# Patient Record
Sex: Female | Born: 1975 | Race: White | Hispanic: No | Marital: Married | Smoking: Never smoker
Health system: Southern US, Community
[De-identification: ages and names within clinical notes are randomized; demographics above are authoritative.]

## PROBLEM LIST (undated history)

## (undated) DIAGNOSIS — N809 Endometriosis, unspecified: Secondary | ICD-10-CM

## (undated) DIAGNOSIS — R519 Headache, unspecified: Secondary | ICD-10-CM

## (undated) DIAGNOSIS — K5190 Ulcerative colitis, unspecified, without complications: Secondary | ICD-10-CM

## (undated) DIAGNOSIS — R51 Headache: Secondary | ICD-10-CM

## (undated) HISTORY — DX: Headache: R51

## (undated) HISTORY — PX: TYMPANOSTOMY TUBE PLACEMENT: SHX32

## (undated) HISTORY — DX: Endometriosis, unspecified: N80.9

## (undated) HISTORY — DX: Headache, unspecified: R51.9

## (undated) HISTORY — DX: Ulcerative colitis, unspecified, without complications: K51.90

---

## 1997-04-17 HISTORY — PX: OVARIAN CYST REMOVAL: SHX89

## 1997-09-02 ENCOUNTER — Other Ambulatory Visit: Admission: RE | Admit: 1997-09-02 | Discharge: 1997-09-02 | Payer: Self-pay | Admitting: Obstetrics

## 1998-09-06 ENCOUNTER — Inpatient Hospital Stay (HOSPITAL_COMMUNITY): Admission: RE | Admit: 1998-09-06 | Discharge: 1998-09-07 | Payer: Self-pay | Admitting: Gynecology

## 2000-04-30 ENCOUNTER — Other Ambulatory Visit: Admission: RE | Admit: 2000-04-30 | Discharge: 2000-04-30 | Payer: Self-pay | Admitting: Gynecology

## 2000-04-30 ENCOUNTER — Encounter (INDEPENDENT_AMBULATORY_CARE_PROVIDER_SITE_OTHER): Payer: Self-pay | Admitting: Specialist

## 2000-05-25 ENCOUNTER — Encounter (INDEPENDENT_AMBULATORY_CARE_PROVIDER_SITE_OTHER): Payer: Self-pay

## 2000-05-25 ENCOUNTER — Other Ambulatory Visit: Admission: RE | Admit: 2000-05-25 | Discharge: 2000-05-25 | Payer: Self-pay | Admitting: Gynecology

## 2000-09-03 ENCOUNTER — Other Ambulatory Visit: Admission: RE | Admit: 2000-09-03 | Discharge: 2000-09-03 | Payer: Self-pay | Admitting: Gynecology

## 2001-01-07 ENCOUNTER — Other Ambulatory Visit: Admission: RE | Admit: 2001-01-07 | Discharge: 2001-01-07 | Payer: Self-pay | Admitting: Gynecology

## 2001-04-17 HISTORY — PX: LEEP: SHX91

## 2001-05-20 ENCOUNTER — Other Ambulatory Visit: Admission: RE | Admit: 2001-05-20 | Discharge: 2001-05-20 | Payer: Self-pay | Admitting: Gynecology

## 2003-04-18 HISTORY — PX: LEFT OOPHORECTOMY: SHX1961

## 2003-04-18 HISTORY — PX: OTHER SURGICAL HISTORY: SHX169

## 2005-04-17 DIAGNOSIS — K5190 Ulcerative colitis, unspecified, without complications: Secondary | ICD-10-CM

## 2005-04-17 HISTORY — DX: Ulcerative colitis, unspecified, without complications: K51.90

## 2005-10-02 ENCOUNTER — Ambulatory Visit: Payer: Self-pay | Admitting: Gastroenterology

## 2005-10-17 ENCOUNTER — Ambulatory Visit: Payer: Self-pay | Admitting: Gastroenterology

## 2005-10-23 ENCOUNTER — Ambulatory Visit: Payer: Self-pay | Admitting: Gastroenterology

## 2005-10-23 ENCOUNTER — Encounter (INDEPENDENT_AMBULATORY_CARE_PROVIDER_SITE_OTHER): Payer: Self-pay | Admitting: Specialist

## 2005-11-06 ENCOUNTER — Encounter: Payer: Self-pay | Admitting: Gastroenterology

## 2005-11-06 ENCOUNTER — Encounter: Payer: Self-pay | Admitting: Obstetrics and Gynecology

## 2005-11-07 ENCOUNTER — Ambulatory Visit: Payer: Self-pay | Admitting: Gastroenterology

## 2005-11-07 ENCOUNTER — Inpatient Hospital Stay (HOSPITAL_COMMUNITY): Admission: AD | Admit: 2005-11-07 | Discharge: 2005-11-10 | Payer: Self-pay | Admitting: Gastroenterology

## 2005-11-16 ENCOUNTER — Ambulatory Visit: Payer: Self-pay | Admitting: Internal Medicine

## 2005-12-13 ENCOUNTER — Other Ambulatory Visit: Admission: RE | Admit: 2005-12-13 | Discharge: 2005-12-13 | Payer: Self-pay | Admitting: Obstetrics and Gynecology

## 2006-01-25 ENCOUNTER — Ambulatory Visit (HOSPITAL_COMMUNITY): Admission: RE | Admit: 2006-01-25 | Discharge: 2006-01-25 | Payer: Self-pay | Admitting: Obstetrics and Gynecology

## 2006-12-19 ENCOUNTER — Other Ambulatory Visit: Admission: RE | Admit: 2006-12-19 | Discharge: 2006-12-19 | Payer: Self-pay | Admitting: Gynecology

## 2008-02-10 ENCOUNTER — Other Ambulatory Visit: Admission: RE | Admit: 2008-02-10 | Discharge: 2008-02-10 | Payer: Self-pay | Admitting: Gynecology

## 2008-10-14 ENCOUNTER — Ambulatory Visit: Payer: Self-pay | Admitting: Oncology

## 2008-11-04 LAB — CBC WITH DIFFERENTIAL (CANCER CENTER ONLY)
BASO#: 0 10*3/uL (ref 0.0–0.2)
BASO%: 0.5 % (ref 0.0–2.0)
EOS%: 1.6 % (ref 0.0–7.0)
Eosinophils Absolute: 0 10*3/uL (ref 0.0–0.5)
HCT: 36.3 % (ref 34.8–46.6)
HGB: 12.9 g/dL (ref 11.6–15.9)
LYMPH#: 1.1 10*3/uL (ref 0.9–3.3)
LYMPH%: 44.7 % (ref 14.0–48.0)
MCH: 31.8 pg (ref 26.0–34.0)
MCHC: 35.6 g/dL (ref 32.0–36.0)
MCV: 89 fL (ref 81–101)
MONO#: 0.2 10*3/uL (ref 0.1–0.9)
MONO%: 9.1 % (ref 0.0–13.0)
NEUT#: 1.1 10*3/uL — ABNORMAL LOW (ref 1.5–6.5)
NEUT%: 44.1 % (ref 39.6–80.0)
Platelets: 180 10*3/uL (ref 145–400)
RBC: 4.07 10*6/uL (ref 3.70–5.32)
RDW: 11.3 % (ref 10.5–14.6)
WBC: 2.4 10*3/uL — ABNORMAL LOW (ref 3.9–10.0)

## 2008-11-04 LAB — CMP (CANCER CENTER ONLY)
ALT(SGPT): 21 U/L (ref 10–47)
AST: 22 U/L (ref 11–38)
Albumin: 3 g/dL — ABNORMAL LOW (ref 3.3–5.5)
Alkaline Phosphatase: 43 U/L (ref 26–84)
BUN, Bld: 15 mg/dL (ref 7–22)
CO2: 27 mEq/L (ref 18–33)
Calcium: 9.1 mg/dL (ref 8.0–10.3)
Chloride: 99 mEq/L (ref 98–108)
Creat: 1.1 mg/dl (ref 0.6–1.2)
Glucose, Bld: 96 mg/dL (ref 73–118)
Potassium: 4.3 mEq/L (ref 3.3–4.7)
Sodium: 140 mEq/L (ref 128–145)
Total Bilirubin: 0.5 mg/dl (ref 0.20–1.60)
Total Protein: 7 g/dL (ref 6.4–8.1)

## 2008-11-04 LAB — LACTATE DEHYDROGENASE: LDH: 127 U/L (ref 94–250)

## 2008-11-04 LAB — MORPHOLOGY - CHCC SATELLITE
PLT EST ~~LOC~~: ADEQUATE
RBC Comments: NORMAL

## 2009-02-11 ENCOUNTER — Ambulatory Visit: Payer: Self-pay | Admitting: Oncology

## 2009-02-15 LAB — CBC WITH DIFFERENTIAL (CANCER CENTER ONLY)
BASO#: 0 10*3/uL (ref 0.0–0.2)
BASO%: 0.5 % (ref 0.0–2.0)
EOS%: 2 % (ref 0.0–7.0)
Eosinophils Absolute: 0.1 10*3/uL (ref 0.0–0.5)
HCT: 37.5 % (ref 34.8–46.6)
HGB: 12.7 g/dL (ref 11.6–15.9)
LYMPH#: 1.2 10*3/uL (ref 0.9–3.3)
LYMPH%: 43.9 % (ref 14.0–48.0)
MCH: 31.2 pg (ref 26.0–34.0)
MCHC: 33.9 g/dL (ref 32.0–36.0)
MCV: 92 fL (ref 81–101)
MONO#: 0.2 10*3/uL (ref 0.1–0.9)
MONO%: 8.9 % (ref 0.0–13.0)
NEUT#: 1.2 10*3/uL — ABNORMAL LOW (ref 1.5–6.5)
NEUT%: 44.7 % (ref 39.6–80.0)
Platelets: 219 10*3/uL (ref 145–400)
RBC: 4.07 10*6/uL (ref 3.70–5.32)
RDW: 11.3 % (ref 10.5–14.6)
WBC: 2.7 10*3/uL — ABNORMAL LOW (ref 3.9–10.0)

## 2009-02-18 ENCOUNTER — Ambulatory Visit (HOSPITAL_COMMUNITY): Admission: RE | Admit: 2009-02-18 | Discharge: 2009-02-18 | Payer: Self-pay | Admitting: Oncology

## 2009-02-24 ENCOUNTER — Encounter: Payer: Self-pay | Admitting: Oncology

## 2009-02-24 ENCOUNTER — Other Ambulatory Visit: Admission: RE | Admit: 2009-02-24 | Discharge: 2009-02-24 | Payer: Self-pay | Admitting: Oncology

## 2009-02-24 LAB — CBC WITH DIFFERENTIAL (CANCER CENTER ONLY)
BASO#: 0 10*3/uL (ref 0.0–0.2)
BASO%: 0.8 % (ref 0.0–2.0)
EOS%: 1.7 % (ref 0.0–7.0)
Eosinophils Absolute: 0.1 10*3/uL (ref 0.0–0.5)
HCT: 38.8 % (ref 34.8–46.6)
HGB: 13 g/dL (ref 11.6–15.9)
LYMPH#: 1.1 10*3/uL (ref 0.9–3.3)
LYMPH%: 38.6 % (ref 14.0–48.0)
MCH: 30.9 pg (ref 26.0–34.0)
MCHC: 33.6 g/dL (ref 32.0–36.0)
MCV: 92 fL (ref 81–101)
MONO#: 0.2 10*3/uL (ref 0.1–0.9)
MONO%: 8.7 % (ref 0.0–13.0)
NEUT#: 1.4 10*3/uL — ABNORMAL LOW (ref 1.5–6.5)
NEUT%: 50.2 % (ref 39.6–80.0)
Platelets: 178 10*3/uL (ref 145–400)
RBC: 4.22 10*6/uL (ref 3.70–5.32)
RDW: 11.4 % (ref 10.5–14.6)
WBC: 2.7 10*3/uL — ABNORMAL LOW (ref 3.9–10.0)

## 2009-03-25 ENCOUNTER — Ambulatory Visit: Payer: Self-pay | Admitting: Oncology

## 2009-03-30 LAB — CBC WITH DIFFERENTIAL (CANCER CENTER ONLY)
BASO#: 0 10*3/uL (ref 0.0–0.2)
BASO%: 0.6 % (ref 0.0–2.0)
EOS%: 2.4 % (ref 0.0–7.0)
Eosinophils Absolute: 0.1 10*3/uL (ref 0.0–0.5)
HCT: 36.8 % (ref 34.8–46.6)
HGB: 12.5 g/dL (ref 11.6–15.9)
LYMPH#: 0.9 10*3/uL (ref 0.9–3.3)
LYMPH%: 40.1 % (ref 14.0–48.0)
MCH: 31 pg (ref 26.0–34.0)
MCHC: 33.9 g/dL (ref 32.0–36.0)
MCV: 91 fL (ref 81–101)
MONO#: 0.2 10*3/uL (ref 0.1–0.9)
MONO%: 9.6 % (ref 0.0–13.0)
NEUT#: 1 10*3/uL — ABNORMAL LOW (ref 1.5–6.5)
NEUT%: 47.3 % (ref 39.6–80.0)
Platelets: 178 10*3/uL (ref 145–400)
RBC: 4.03 10*6/uL (ref 3.70–5.32)
RDW: 10.8 % (ref 10.5–14.6)
WBC: 2.2 10*3/uL — ABNORMAL LOW (ref 3.9–10.0)

## 2010-05-12 ENCOUNTER — Ambulatory Visit: Payer: Self-pay | Admitting: Oncology

## 2010-05-16 LAB — CBC WITH DIFFERENTIAL/PLATELET
BASO%: 0.5 % (ref 0.0–2.0)
Basophils Absolute: 0 10*3/uL (ref 0.0–0.1)
EOS%: 1.2 % (ref 0.0–7.0)
Eosinophils Absolute: 0 10*3/uL (ref 0.0–0.5)
HCT: 38.3 % (ref 34.8–46.6)
HGB: 13.1 g/dL (ref 11.6–15.9)
LYMPH%: 35.4 % (ref 14.0–49.7)
MCH: 30.5 pg (ref 25.1–34.0)
MCHC: 34.2 g/dL (ref 31.5–36.0)
MCV: 89.1 fL (ref 79.5–101.0)
MONO#: 0.3 10*3/uL (ref 0.1–0.9)
MONO%: 9.7 % (ref 0.0–14.0)
NEUT#: 1.6 10*3/uL (ref 1.5–6.5)
NEUT%: 53.2 % (ref 38.4–76.8)
Platelets: 195 10*3/uL (ref 145–400)
RBC: 4.3 10*6/uL (ref 3.70–5.45)
RDW: 12.1 % (ref 11.2–14.5)
WBC: 3 10*3/uL — ABNORMAL LOW (ref 3.9–10.3)
lymph#: 1.1 10*3/uL (ref 0.9–3.3)

## 2010-06-03 ENCOUNTER — Other Ambulatory Visit: Payer: Self-pay | Admitting: Gynecology

## 2010-07-20 LAB — BONE MARROW EXAM

## 2010-07-20 LAB — CHROMOSOME ANALYSIS, BONE MARROW

## 2010-09-02 NOTE — H&P (Signed)
Jasmine May, Jasmine May               ACCOUNT NO.:  [id number]   MEDICAL RECORD NO.:  [id number]          PATIENT TYPE:  INP   LOCATION:  5506                         FACILITY:  [facility]   PHYSICIAN:  Malcolm T. Russella Dar, M.D. [facility] OF BIRTH:  06-11-1975   DATE OF ADMISSION:  11/07/2005  DATE OF DISCHARGE:                                HISTORY & PHYSICAL   CHIEF COMPLAINT:  Worsening diarrhea and abdominal pain.   HISTORY OF PRESENT ILLNESS:  The patient has had a history of lower  abdominal pain with alternating diarrhea and constipation that initially was  thought to be IBS, but then she developed bright red blood per rectum and  mucus per rectum for several weeks and underwent a colonoscopy on October 23, 2005, showing left-sided colitis typical appearance for ulcerative colitis  and the biopsy showed IBD changes, ulcerative colitis favored over that of  Crohn's disease.  She was started on Colazal and Rowasa enemas on July 10.  By 3 days later, her symptoms had worsened and progressed in severity even  with the addition of prednisone and Vicodin on July 19.  She was in contact  with the office regarding her problems.  She had been having fevers to 101  degrees x2 days and ongoing nausea.  A CT scan was ordered for July 23, that  showed a question of a left dermoid cyst torsion.  She went to ER at [facility] for exam and a pelvic Doppler ultrasound which did not confirm the  torsion.  She was seen by Dr. Salvadore Dom.  She went home, but continues to  do poorly and was seen today in the office by Dr. Russella Dar and he admitted her  for supportive care.  There is a question as to perhaps her having a  secondary infection on top of her inflammatory bowel disease.   ALLERGIES:  None.   MEDICATIONS:  1. Colazal 750 mg three p.o. t.i.d.  2. Prednisone 40 mg daily.  3. Robinul Forte 1 daily.  4. Rowasa enemas at bedtime.  5. NuvaRing birth control every 3 weeks.  6. Vicodin 1-2 every 6  hours.   PAST MEDICAL HISTORY:  1. History of seasonal allergies.  2. Status post left ovarian cystectomy in 1998.  3. LEEP in 2003.  4. Tubes in her ears in 1979.  5. Hip surgery in 1992.  6. Prior history of migraine headaches.   SOCIAL HISTORY:  She is married.  She lives in [place].  She does not  smoke.  She does not drink.   PHYSICAL EXAMINATION:  VITAL SIGNS:  Blood pressure 106/52, pulse is 98,  respirations 18, temperature 98.5, weight 122.4 pounds.  HEENT:  Exam sclerae are nonicteric.  Conjunctiva is pink.  Extraocular  movements intact.  Oropharynx and mucous membranes are very dry.  No  exudates.  NECK:  Neck is supple, no JVD, no masses, no thyromegaly.  CHEST:  Clear to auscultation and percussion bilaterally.  No shortness of  breath.  No cough.  CARDIAC:  Regular rate and rhythm.  No murmurs, rubs or gallops.  ABDOMEN:  Abdomen is tender in the periumbilical region, no rebound,  guarding, hepatosplenomegaly, masses or hernias.  RECTAL:  Rectal exam was deferred.  Note, that she had no neoplasia just  colitis on the colonoscopy on July 9.  NEUROLOGIC:  No tremor.  Full strength in the upper and lower extremities.  She is alert and oriented x3.  Exam is without tremor.  DERMATOLOGIC:  No  jaundice.  No pallor.   LABORATORY DATA AND X-RAY FINDINGS:  CT scan of the abdomen and pelvis of  July 23, showed diffuse mild circumferential wall thickening of the colon  consistent with her ulcerative colitis.  No perforation, no abscess.  Also  seen was borderline mesenteric adenopathy likely inflammatory in nature.  A  large heterogeneous mass of 5.8 x 5.4 cm consistent with an ovarian dermoid.  The mass predisposes the patient to ovarian torsion.  There is also another  smaller fat containing lesion in the right ovary, also likely a dermoid.  There is a small amount of free fluid, but no abscesses.   IMPRESSION:  1. Left-sided ulcerative colitis.  Rule out  superimposed infectious      colitis.  Rule out Colazal side effects.  2. Left dermoid cyst seen on computed tomography scan yesterday.  Torsion      has been excluded by pelvic ultrasound yesterday.  3. Dehydration.   PLAN:  Plan to get stool studies including stool cultures and C. difficile  studies.  Discontinue the Colazal and support with IV fluids and IV  antibiotics of Cipro and Flagyl.  Pain control p.r.n.  Discontinue the  Rowasa enemas.  If the patient fails to improve within the next 24-48 hours,  will probably need a flexible sigmoidoscopy.   FAMILY HISTORY:  Mother and father both have elevated cholesterol.  Mother  has a mitral valve prolapse.  There is a first cousin who has inflammatory  bowel disease.     ______________________________  Jennye Moccasin, PA-C      ______________________________  Venita Lick. Russella Dar, M.D. [facility]    SG/MEDQ  D:  11/07/2005  T:  11/07/2005  Job:  [id number]

## 2010-09-02 NOTE — Assessment & Plan Note (Signed)
[facility]                           GASTROENTEROLOGY ON-CALL NOTE   KADIN, BERA                        MRN:          [id number]  DATE:11/02/2005                            DOB:          01-04-1976    TELEPHONE MESSAGE:   TELEPHONE NUMBER:  [phone].   Jasmine May is a 35 year old patient of Dr. Russella Dar with ulcerative colitis who  has had exacerbation of her disease and called the office yesterday and was  told to start on prednisone.  For some reason, prednisone was not called in  and she called in several times today and again did not get any response.  She was told that she would be put on 30 mg of prednisone.  She is having  severe abdominal pain.  She wanted me to call the prednisone in.  It appears  that she is having diarrhea, rectal bleeding and abdominal pain and she has  an appointment with Dr. Russella Dar.  I have called in prednisone as well as 20  tablets of Vicodin for her to [employer] at [phone] and I called the  patient back to assure her that the medications have been called in.  She  will start on prednisone 30 mg a day for a week and then cut back to 20 mg.  She also is to continue on her Robinul 42 mg twice a day.                                   Hedwig Morton. Juanda Chance, MD   DMB/MedQ  DD:  11/02/2005  DT:  11/03/2005  Job #:  [id number]   cc:   Venita Lick. Pleas Koch., MD, [facility]

## 2010-09-02 NOTE — Assessment & Plan Note (Signed)
[facility]                           GASTROENTEROLOGY OFFICE NOTE   Jasmine, May                    MRN:          [id number]  DATE:11/16/2005                            DOB:          1975/11/17    HISTORY:  The patient presents for post-hospitalization followup.  She is a  35 year old who was initially evaluated by Dr. Russella Dar, October 02, 2005 for  abdominal pain, rectal bleeding and alternating bowel habits.  She  subsequently underwent complete colonoscopy, October 23, 2005.  She was found to  have moderate left-sided colitis consistent with ulcerative colitis.  Biopsies from the left colon were consistent with ulcerative colitis.  Biopsies of the transverse colon and right colon were unremarkable.  She was  treated with Colazal and Mesalamine enemas.  She improved for a few days,  only to have worsening symptoms.  She was subsequently placed on 30 mg of  prednisone, without improvement.  Thereafter, she developed severe left-  sided abdominal pain and underwent a CT scan of the abdomen and pelvis.  She  was found to have a large heterogenous mass in the left adnexa, consistent  with an ovarian dermoid.  There was concern that she may have torsion.  However, after an evaluation at [facility] by Dr. Artist Pais, this  was apparently not the case (transvaginal ultrasound performed).  She was  then seen by Dr. Russella Dar in the office with ongoing symptoms and directly  admitted to the hospital.  In the hospital, the patient was placed on bowel  rest, IV steroids, and her aminosalicylates held.  By chance, she may have  been having a paradoxical reaction to the drugs.  She was also treated with  antispasmodics and provided analgesia.  In time, she improved in terms of  all parameters and was discharged home after four days.  Due to the  patient's preference, she has transferred her care to me.  She presents now  for one week followup.  Since  discharge, she has done well.  She has no  problems with recurrent pain.  She describes one soft but formed stool  daily.  No mucus or blood.  No fevers.  She is a little edgy on the  prednisone but otherwise well.  She is sleeping well.   CURRENT MEDICATIONS:  1.  Prednisone 40 mg daily.  2.  Vicodin p.r.n.  3.  NuvaRing.  4.  Multivitamin.  5.  Levbid and Lomotil though is not needing these.   PHYSICAL EXAMINATION:  Well-appearing female in no acute distress.  BLOOD PRESSURE:  106/52.  HEART RATE:  98 and regular.  WEIGHT:  122.4 pounds.  TEMPERATURE:  98.5 degrees Fahrenheit.  HEENT:  Sclerae are anicteric.  Conjunctivae are pink.  Oral mucosa intact.  LUNGS:  Clear.  HEART:  Regular.  ABDOMEN:  Soft and without tenderness, mass or hernia.   IMPRESSION:  Left-sided ulcerative colitis.  Recent flare up requiring a  short hospitalization.  Currently asymptomatic on 40 mg of prednisone.   RECOMMENDATIONS:  1.  We had a long discussion today regarding the nature of ulcerative  colitis, treatment, and expected course.  Literature provided as well as      access to the [facility] [employer] site.  2.  Prednisone taper over the next eight weeks, as outlined to the patient.  3.  Re-institute aminosalicylate therapy.  Would like to place her on Lialda      2.4 grams daily.  4.  Follow up with Dr. Thomasena Edis, as planned, regarding management of left      adnexal mass.  I feel this was responsible for her acute pain, despite      no evidence of torsion.  It would be unusual for her colitis to have      caused that type of pain, given the endoscopic appearance.  5.  Office followup here in at about one month after her gynecology      followup.                                   Wilhemina Bonito. Eda Keys., MD   JNP/MedQ  DD:  11/19/2005  DT:  11/20/2005  Job #:  [id number]   cc:   Artist Pais, MD  Leatha Gilding. Mezer, MD

## 2010-09-02 NOTE — Discharge Summary (Signed)
Jasmine May, Jasmine May               ACCOUNT NO.:  [id number]   MEDICAL RECORD NO.:  [id number]          PATIENT TYPE:  INP   LOCATION:  5505                         FACILITY:  [facility]   PHYSICIAN:  Wilhemina Bonito. Marina Goodell, MD      DATE OF BIRTH:  October 29, 1975   DATE OF ADMISSION:  11/07/2005  DATE OF DISCHARGE:  11/10/2005                                 DISCHARGE SUMMARY   ADMISSION DIAGNOSES:  1. History of left-sided ulcerative colitis.  Now with worsening diarrhea      and abdominal pain despite medical therapy.  Rule out superimposed      infectious colitis, rule out gastrointestinal/colonic side effects from      Colazal.  2. Left dermoid cyst of the pelvis.  3. Dehydration with hypotension.  4. Seasonal allergies.  5. Headaches labeled as migraines in the past.  6. Status post loop electrosurgical excision procedure surgery in 2003      also status post.  7. Status post hip surgery in 1992.  8. Status post tube placement in the ear canals in 1979.   DISCHARGE DIAGNOSES:  1. Ulcerative colitis flare with improvement on empiric Flagyl and steroid      dosing.  2. Dermoid ovarian cyst.  3. Anemia, normocytic.  4. Hyperglycemia, probably secondary to steroid therapy.  5. Hypokalemia, resolved.  6. Fecal occult blood positive secondary to colitis.   PROCEDURES:  None.   CONSULTATION:  None.   BRIEF HISTORY:  Jasmine May is a 35 year old white female who has recently  relocated from [place] to [place].  She underwent a colonoscopy on July 9 by  Dr. Russella Dar to work up a history of alternating diarrhea and constipation as  well as a history of bleeding per rectum which had been on for several weeks  prior to the colonoscopy.  The colonoscopy showed left-sided colitis with  mucosal changes typical for UC.  Biopsies also showed changes that favored a  diagnosis of UC over Crohn disease.  She was started on July 10 on Colazal  as well as Rowasa enemas.  Within 3 days her symptoms had worsened  and she  was feeling weak secondary to increased stooling.  The 19th she was started  on prednisone as well as a p.r.n. Vicodin for abdominal pain.  She failed to  improve with all of these measures and diarrhea worsened although the  bleeding had slowed.  For a couple of days prior to admission she had fevers  to 101 and is experiencing ongoing nausea.  A CT scan was performed on July  23 showing a question of torsion and a left dermoid cyst.  She was seen by a  gynecologist, Dr. Thomasena Edis, and had a pelvic ultrasound which showed failed  to demonstrate any torsion.  She was admitted by Dr. Russella Dar for supportive  care and further workup.   LABORATORIES:  Initial hemoglobin 12.3, 10.6 at discharge.  Hematocrit 36 on  admission, 30.5 at discharge.  Platelets 257,000.  White blood cell count  initially 11, 9.1 at discharge.  MCV 87.  Fecal occult blood positive.  ESR  36.  Initial potassium 3.2, corrected to 4.8.  Sodium 135.  Glucose  initially 95 but reached as high as 140.  BUN 7, creatinine 0.9.  Calcium  8.0, albumin 2.6.  Total bilirubin 1.0, alkaline phosphatase 39, AST 12, ALT  8.  Lipase 18.  Quantitative HCG was less than 3, which is an negative.  Stool culture revealed yielded no Salmonella, Shigella, Campylobacter or  Yersinia.  C. difficile toxin on 3 occasions always negative.  Giardia,  Cryptosporidium screening negative.   HOSPITAL COURSE:  Problem 1.  UNCONTROLLED COLITIS SYMPTOMS:  The patient was admitted to the  hospital and hydrated with IV fluids.  She was started on 40 mg of Solu-  Medrol twice daily as well as empiric Flagyl at 250 mg 4 times daily and  ciprofloxacin IV.  She had p.r.n. pain medications available.  Overnight she  had a decrease in the midabdominal pain and a slight decrease in stool  frequency.  Overall, there was not a whole lot of improvement.  Blood  pressure was generally low throughout the hospitalization with normal  systolic blood pressures in  the 80s and 90s and diastolic blood pressures in  the 50s and 60s.  She was continued on a course of empiric antibiotics.  Eventually the patient's diet was advanced from sips of clear liquids to a  low-residue diet.  Cortenemas were added in the evening along with Lomotil  twice daily.  Eventually the patient's stooling pattern rate was  significantly reduced; in fact, on the day of discharge she had not had any  bowel movements overnight or for several hours that day.  Rectal spasm had  been an issue, and this was resolving nicely.  She was feeling a lot better,  was hungry, tolerating a low-residue diet, and Dr. Marina Goodell discharged her to  home in stable and improved condition.  Medication changes during this  admission included discontinuing the Rowasa enemas as well as the Colazal  and adding Cortenemas and the discharge dose of prednisone was 40 mg a day  rather than the 30 mg day she had been taking on admission.  She also had  available hyoscyamine for cramping pain in addition to Vicodin.  She had a  follow-up appointment with Dr. Marina Goodell, she requested a shifting of physician  from Dr. Russella Dar to Dr. Marina Goodell, on August 2 at 3:30 p.m.   Various stool studies for infectious agents were obtained, none of which  were positive including multiple C. difficile studies.  There was no plan to  continue antibiotics at discharge.   Problem 2.  HYPERGLYCEMIA:  Serum glucoses did reveal elevations to a  maximum of 140 once she was taking Solu-Medrol.  She did not require any  sliding-scale insulin and we did not adjust her diet to that of a diabetic.   Problem 3.  NORMOCYTIC ANEMIA:  Her stools were heme-positive, which would  be expected in a patient was colitis.  Her MCV was normal.  We did not do  any iron, B12 or folate studies.   Problem 4.  OVARIAN DERMOID CYST:  No evidence for torsion and not felt to be the source of the patient's pelvic abdominal pain by her GYN, Dr.  Thomasena Edis.    MEDICATIONS AT DISCHARGE:  1. Prednisone 40 mg daily.  2. Lomotil one tablet twice daily p.r.n.  3. Cortenema at bedtime, could be discontinued at patient discretion if      rectal pain resolved.  4. Calcium  500 mg with vitamin D twice daily.  5. Hyoscyamine 0.375 mg one to two every 12 hours as needed for cramping      abdominal pain.  6. Multivitamin once daily.  7. Vicodin one every 6 hours p.r.n. pain.  8. NuvaRing (internal intravaginal contraceptive), replaced monthly.  9. Ambien 5 mg at h.s. as needed for rest.     ______________________________  Jennye Moccasin, PA-C    ______________________________  Wilhemina Bonito. Marina Goodell, MD    SG/MEDQ  D:  12/27/2005  T:  12/28/2005  Job:  [id number]

## 2010-09-02 NOTE — Assessment & Plan Note (Signed)
[facility]                                   ON-CALL NOTE   Jasmine May, Jasmine May                    MRN:          [id number]  DATE:11/01/2005                            DOB:          Aug 17, 1975    [name] is a 35 year old white woman that had ulcerative colitis, diagnosed  by Dr. Russella Dar, at colonoscopy about a week to 10 days ago.  She started on  Colazal and Asacol.  She felt a little better initially, but she really has  had persistent diarrhea, perhaps somewhat worse, with some crampy abdominal  pain and bleeding.  She is a little upset by this.  I explained to her that  it can take weeks for the medicine to work.  It is also possible she is  having a paradoxical reaction to Colazal.  She has not had any fever.  I  recommended that she use some Imodium if she has it.  If she gets severe  abdominal pain or fever, to stop that and contact us.  We will pass the  message along to Dr. Russella Dar, her regular gastroenterologist, about her  persistent symptoms and he can decide whether or not to change her therapy.                                   Iva Boop, MD, [facility]   CEG/MedQ  DD:  11/01/2005  DT:  11/02/2005  Job #:  [id number]   cc:   Venita Lick. Pleas Koch., MD, [facility]

## 2010-11-01 ENCOUNTER — Encounter: Payer: Self-pay | Admitting: Internal Medicine

## 2010-12-04 ENCOUNTER — Emergency Department (HOSPITAL_COMMUNITY)
Admission: EM | Admit: 2010-12-04 | Discharge: 2010-12-04 | Disposition: A | Discharge status: Home / Self Care | Payer: 59 | Attending: Emergency Medicine | Admitting: Emergency Medicine

## 2010-12-04 DIAGNOSIS — R109 Unspecified abdominal pain: Secondary | ICD-10-CM | POA: Insufficient documentation

## 2010-12-04 DIAGNOSIS — R112 Nausea with vomiting, unspecified: Secondary | ICD-10-CM | POA: Insufficient documentation

## 2010-12-04 DIAGNOSIS — K5289 Other specified noninfective gastroenteritis and colitis: Secondary | ICD-10-CM | POA: Insufficient documentation

## 2010-12-04 DIAGNOSIS — R197 Diarrhea, unspecified: Secondary | ICD-10-CM | POA: Insufficient documentation

## 2010-12-04 LAB — URINALYSIS, ROUTINE W REFLEX MICROSCOPIC
Glucose, UA: NEGATIVE mg/dL
Hgb urine dipstick: NEGATIVE
Ketones, ur: NEGATIVE mg/dL
Leukocytes, UA: NEGATIVE
Nitrite: NEGATIVE
Protein, ur: NEGATIVE mg/dL
Specific Gravity, Urine: 1.027 (ref 1.005–1.030)
Urobilinogen, UA: 1 mg/dL (ref 0.0–1.0)
pH: 6 (ref 5.0–8.0)

## 2010-12-04 LAB — POCT I-STAT, CHEM 8
BUN: 10 mg/dL (ref 6–23)
Calcium, Ion: 1.17 mmol/L (ref 1.12–1.32)
Chloride: 104 mEq/L (ref 96–112)
Creatinine, Ser: 0.8 mg/dL (ref 0.50–1.10)
Glucose, Bld: 100 mg/dL — ABNORMAL HIGH (ref 70–99)
HCT: 36 % (ref 36.0–46.0)
Hemoglobin: 12.2 g/dL (ref 12.0–15.0)
Potassium: 3.5 mEq/L (ref 3.5–5.1)
Sodium: 138 mEq/L (ref 135–145)
TCO2: 24 mmol/L (ref 0–100)

## 2010-12-04 LAB — POCT PREGNANCY, URINE: Preg Test, Ur: NEGATIVE

## 2010-12-28 ENCOUNTER — Other Ambulatory Visit: Payer: Self-pay | Admitting: Gastroenterology

## 2011-01-02 ENCOUNTER — Ambulatory Visit
Admission: RE | Admit: 2011-01-02 | Discharge: 2011-01-02 | Disposition: A | Discharge status: Home / Self Care | Payer: 59 | Source: Ambulatory Visit | Attending: Gastroenterology | Admitting: Gastroenterology

## 2011-02-08 ENCOUNTER — Encounter: Payer: Self-pay | Admitting: Internal Medicine

## 2011-02-10 ENCOUNTER — Encounter: Payer: Self-pay | Admitting: Gastroenterology

## 2012-06-18 ENCOUNTER — Other Ambulatory Visit: Payer: Self-pay | Admitting: Gastroenterology

## 2012-07-16 ENCOUNTER — Other Ambulatory Visit: Payer: Self-pay | Admitting: Gastroenterology

## 2012-09-21 ENCOUNTER — Emergency Department: Payer: Self-pay | Admitting: Emergency Medicine

## 2012-09-23 LAB — BETA STREP CULTURE(ARMC)

## 2013-08-08 ENCOUNTER — Other Ambulatory Visit: Payer: Self-pay | Admitting: Gastroenterology

## 2014-06-22 ENCOUNTER — Other Ambulatory Visit: Payer: Self-pay | Admitting: Gynecology

## 2014-06-23 LAB — CYTOLOGY - PAP

## 2014-07-15 ENCOUNTER — Other Ambulatory Visit: Payer: Self-pay | Admitting: Gynecology

## 2014-07-16 LAB — CYTOLOGY - PAP

## 2015-03-17 ENCOUNTER — Ambulatory Visit (INDEPENDENT_AMBULATORY_CARE_PROVIDER_SITE_OTHER): Payer: 59 | Admitting: Podiatry

## 2015-03-17 ENCOUNTER — Ambulatory Visit (INDEPENDENT_AMBULATORY_CARE_PROVIDER_SITE_OTHER): Payer: 59

## 2015-03-17 VITALS — BP 119/73 | HR 68 | Resp 16 | Ht 66.0 in | Wt 140.0 lb

## 2015-03-17 DIAGNOSIS — M722 Plantar fascial fibromatosis: Secondary | ICD-10-CM

## 2015-03-17 MED ORDER — TRIAMCINOLONE ACETONIDE 10 MG/ML IJ SUSP
10.0000 mg | Freq: Once | INTRAMUSCULAR | Status: AC
  Administered 2015-03-17: 10 mg

## 2015-03-17 NOTE — Patient Instructions (Signed)

## 2015-03-17 NOTE — Progress Notes (Signed)
   Subjective:    Patient ID: Jasmine May, female    DOB: [birth year], 39 y.o.   MRN: [id number]  HPI  Patient presents with bilateral foot pain, heel. Going on for past 6 weeks.  Patient also presents with ankle pain in their left foot, lateral side. Pt stated, "has sprained ankle 3 times in September 2016".  Pt saw Dr. Al CorpusHyatt approximately 8 years ago and was diagnosed with Plantar Fasciitis.     Review of Systems  All other systems reviewed and are negative.      Objective:   Physical Exam        Assessment & Plan:

## 2015-03-17 NOTE — Progress Notes (Signed)
Subjective:     Patient ID: Jasmine May, female   DOB: [birth year], [age] y.o.   MRN: [id number]  HPI patient states my heels have become severely sore again and I been trying to exercise and run. I also lost my night splint and I need a new one as it was helpful and I need new orthotics   Review of Systems  All other systems reviewed and are negative.      Objective:   Physical Exam  Constitutional: She is oriented to person, place, and time.  Musculoskeletal: Normal range of motion.  Neurological: She is oriented to person, place, and time.  Skin: Skin is warm.  Nursing note and vitals reviewed.  neurovascular status intact muscle strength adequate range of motion within normal limits with patient noted to have thin heels and exquisite discomfort in the medial band left heel at its insertion into the calcaneus. Patient has moderate depression of the arch bilateral and inability to walk without extreme discomfort and is noted to have good digital perfusion and is well oriented 3     Assessment:     Planter fasciitis left with structural imbalances of the foot    Plan:     H&P conditions and x-rays reviewed and today I injected the plantar fascial left 3 mg Kenalog 5 mill grams Xylocaine and applied fascial brace difference lifting of the arch. I then went ahead and scanned for custom orthotic devices to take stress off her feet and will reappoint when those are ready

## 2015-03-23 ENCOUNTER — Telehealth: Payer: Self-pay

## 2015-03-23 NOTE — Telephone Encounter (Signed)
Returning Jasmine May's call, wanted to set up a new patient appt for herself with Dr. Marice Potterove. No answer, left message, instructing her to return my call here at the office

## 2015-04-08 ENCOUNTER — Ambulatory Visit: Payer: 59 | Admitting: *Deleted

## 2015-04-08 DIAGNOSIS — M722 Plantar fascial fibromatosis: Secondary | ICD-10-CM

## 2015-04-08 NOTE — Progress Notes (Signed)
Patient ID: Jasmine GarnetMichelle Lee May, female   DOB: November 09, 1975, 39 y.o.   MRN: [id number] Patient presents for orthotic pick up.  Verbal and written break in and wear instructions given.  Patient will follow up in 4 weeks if symptoms worsen or fail to improve.

## 2015-04-08 NOTE — Patient Instructions (Signed)

## 2015-04-14 ENCOUNTER — Ambulatory Visit (INDEPENDENT_AMBULATORY_CARE_PROVIDER_SITE_OTHER): Payer: 59 | Admitting: Obstetrics & Gynecology

## 2015-04-14 ENCOUNTER — Encounter: Payer: Self-pay | Admitting: Obstetrics & Gynecology

## 2015-04-14 VITALS — BP 118/87 | HR 93 | Resp 18 | Ht 66.0 in | Wt 147.0 lb

## 2015-04-14 DIAGNOSIS — N760 Acute vaginitis: Secondary | ICD-10-CM

## 2015-04-14 DIAGNOSIS — N898 Other specified noninflammatory disorders of vagina: Secondary | ICD-10-CM

## 2015-04-14 DIAGNOSIS — L298 Other pruritus: Secondary | ICD-10-CM | POA: Diagnosis not present

## 2015-04-14 MED ORDER — METRONIDAZOLE 500 MG PO TABS: 500.0000 mg | ORAL_TABLET | Freq: Two times a day (BID) | ORAL | Status: DC

## 2015-04-14 MED ORDER — FLUCONAZOLE 150 MG PO TABS: 150.0000 mg | ORAL_TABLET | Freq: Once | ORAL | Status: DC

## 2015-04-14 MED ORDER — LO LOESTRIN FE 1 MG-10 MCG / 10 MCG PO TABS: 1.0000 | ORAL_TABLET | Freq: Every day | ORAL | Status: DC

## 2015-04-14 NOTE — Progress Notes (Signed)
   Subjective:    Patient ID: Amadeo GarnetMichelle Lee Dufrane, female    DOB: November 01, 1975, 39 y.o.   MRN: [id number]  HPI 39 yo MW P2 (4112 and 39 yo daughters) here today with the complaint of vaginal itching for about 5 days. She has not used any OTC meds.   Review of Systems Pap due 2/17 Uses Lo lo estrin for contraception.     Objective:   Physical Exam [id number] Breathing, conversing, and ambulating normally Vulva- shaved, no lesions Vagina- frothy white discharge c/w BV     Assessment & Plan:  Probable BV- wet prep sent, flagyl prescribed Diflucan script given prn RTC for annual in the spring

## 2015-04-15 LAB — WET PREP, GENITAL
Trich, Wet Prep: NONE SEEN
Yeast Wet Prep HPF POC: NONE SEEN

## 2015-04-21 ENCOUNTER — Telehealth: Payer: Self-pay | Admitting: *Deleted

## 2015-04-21 NOTE — Telephone Encounter (Signed)
Called pt, no answer, left message to call office.

## 2015-04-21 NOTE — Telephone Encounter (Signed)
-----   Message from CourtlandJacinda S Battle sent at 04/20/2015  4:18 PM EST ----- Regarding: Rx Advise Contact: [phone] Taken the med for BV, having some burning and itching, thinks she is getting a yeast infection, wants to know if she can take both meds together or should she wait until she finishes the first one

## 2015-04-26 ENCOUNTER — Telehealth: Payer: Self-pay | Admitting: Obstetrics and Gynecology

## 2015-04-26 NOTE — Telephone Encounter (Signed)
Jasmine May called the office on 01/06, spoke with after hours answering service. She complained of symptoms of a yeast infection, after hours answering service paged the MD on call, who advised her that she would need to return to the office to be seen. Called patient on 04/26/2015 @ 1256 in an attempt to schedule her an appt. Patient states she no longer has symptoms of a yeast infection, declined making an appt at this time. Instructed her to call the office back if symptoms return.

## 2015-05-20 ENCOUNTER — Encounter: Payer: Self-pay | Admitting: *Deleted

## 2015-06-03 ENCOUNTER — Ambulatory Visit: Payer: 59 | Admitting: Podiatry

## 2015-09-20 ENCOUNTER — Other Ambulatory Visit: Payer: Self-pay | Admitting: Gastroenterology

## 2016-01-09 ENCOUNTER — Encounter: Payer: Self-pay | Admitting: Emergency Medicine

## 2016-01-09 DIAGNOSIS — Z79899 Other long term (current) drug therapy: Secondary | ICD-10-CM | POA: Diagnosis not present

## 2016-01-09 DIAGNOSIS — Z792 Long term (current) use of antibiotics: Secondary | ICD-10-CM | POA: Insufficient documentation

## 2016-01-09 DIAGNOSIS — R1110 Vomiting, unspecified: Secondary | ICD-10-CM | POA: Diagnosis not present

## 2016-01-09 DIAGNOSIS — R51 Headache: Secondary | ICD-10-CM | POA: Diagnosis present

## 2016-01-09 DIAGNOSIS — Z5321 Procedure and treatment not carried out due to patient leaving prior to being seen by health care provider: Secondary | ICD-10-CM | POA: Diagnosis not present

## 2016-01-09 MED ORDER — ONDANSETRON 4 MG PO TBDP
4.0000 mg | ORAL_TABLET | Freq: Once | ORAL | Status: AC
  Administered 2016-01-09: 4 mg via ORAL

## 2016-01-09 MED ORDER — ONDANSETRON 4 MG PO TBDP
ORAL_TABLET | ORAL | Status: DC
Start: 2016-01-09 — End: 2016-01-10
  Filled 2016-01-09: qty 1

## 2016-01-09 NOTE — ED Triage Notes (Signed)
Pt presents to ED with c/o severe migraine headache since around 1900 tonight. Pt states she has a hx of the same but this is worse then any previous. Vomiting X1 and sensitivity to light and sounds.

## 2016-01-10 ENCOUNTER — Emergency Department
Admission: EM | Admit: 2016-01-10 | Discharge: 2016-01-10 | Disposition: A | Discharge status: Home / Self Care | Payer: 59 | Attending: Emergency Medicine | Admitting: Emergency Medicine

## 2016-01-10 MED ORDER — SODIUM CHLORIDE 0.9 % IV BOLUS (SEPSIS): 1000.0000 mL | Freq: Once | INTRAVENOUS | Status: DC

## 2016-01-10 MED ORDER — DIPHENHYDRAMINE HCL 50 MG/ML IJ SOLN: 25.0000 mg | Freq: Once | INTRAMUSCULAR | Status: DC

## 2016-01-10 MED ORDER — KETOROLAC TROMETHAMINE 30 MG/ML IJ SOLN
30.0000 mg | Freq: Once | INTRAMUSCULAR | Status: DC
Start: 2016-01-10 — End: 2016-01-10

## 2016-01-10 MED ORDER — METOCLOPRAMIDE HCL 5 MG/ML IJ SOLN: 10.0000 mg | Freq: Once | INTRAMUSCULAR | Status: DC

## 2016-01-10 NOTE — ED Notes (Signed)
In to assess patient.  Patient states that she feels back to her normal.  States she came because her usual medications did not work in the time frame they usually do.  Patient states that she has a headache specialist and that she plans to call him and she just wants to go home.  Encouraged patient to stay for exam by physician, but patient refused.  Encouraged patient to return if any return of symptoms or other concerns, verbalized understanding.

## 2016-01-31 ENCOUNTER — Telehealth: Payer: Self-pay | Admitting: *Deleted

## 2016-01-31 MED ORDER — DESOGESTREL-ETHINYL ESTRADIOL 0.15-30 MG-MCG PO TABS: 1.0000 | ORAL_TABLET | Freq: Every day | ORAL | 11 refills | Status: DC

## 2016-01-31 NOTE — Telephone Encounter (Signed)
Pt called in stating she can't refill Lo-Loestrin due to insurance not covering this medication any longer. She would like the same strength OCP but wants to know if there are other options that could possible be covered.

## 2016-01-31 NOTE — Telephone Encounter (Signed)
Sent Apri per dr Shawnie Ponspratt

## 2016-01-31 NOTE — Telephone Encounter (Signed)
error 

## 2016-05-06 ENCOUNTER — Other Ambulatory Visit: Payer: Self-pay | Admitting: Obstetrics & Gynecology

## 2016-06-05 ENCOUNTER — Encounter: Payer: Self-pay | Admitting: Obstetrics & Gynecology

## 2016-06-05 ENCOUNTER — Ambulatory Visit (INDEPENDENT_AMBULATORY_CARE_PROVIDER_SITE_OTHER): Payer: 59 | Admitting: Obstetrics & Gynecology

## 2016-06-05 VITALS — BP 127/90 | HR 72 | Ht 66.0 in | Wt 147.0 lb

## 2016-06-05 DIAGNOSIS — Z01419 Encounter for gynecological examination (general) (routine) without abnormal findings: Secondary | ICD-10-CM

## 2016-06-05 DIAGNOSIS — Z0000 Encounter for general adult medical examination without abnormal findings: Secondary | ICD-10-CM | POA: Diagnosis not present

## 2016-06-05 MED ORDER — LEVONORGEST-ETH ESTRAD 91-DAY 0.15-0.03 &0.01 MG PO TABS: 1.0000 | ORAL_TABLET | Freq: Every day | ORAL | 4 refills | Status: DC

## 2016-06-05 MED ORDER — FLUCONAZOLE 150 MG PO TABS: 150.0000 mg | ORAL_TABLET | Freq: Once | ORAL | 3 refills | Status: AC

## 2016-06-05 NOTE — Progress Notes (Signed)
Subjective:    Jasmine May is a 41 y.o. MW P2 (5419 and 41 yo daughters) female who presents for an annual exam. The patient has no complaints today. The patient is sexually active. GYN screening history: last pap: was normal. The patient wears seatbelts: yes. The patient participates in regular exercise: yes. Has the patient ever been transfused or tattooed?: yes. The patient reports that there is not domestic violence in her life.   Menstrual History: OB History    Gravida Para Term Preterm AB Living   3 2 2   1 2    SAB TAB Ectopic Multiple Live Births   1       2      Menarche age: [age] No LMP recorded. Patient is not currently having periods (Reason: Oral contraceptives).    The following portions of the patient's history were reviewed and updated as appropriate: allergies, current medications, past family history, past medical history, past social history, past surgical history and problem list.  Review of Systems Pertinent items are noted in HPI.   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, Radio producerquality engineer) Daughter is a Printmakerreshman at [place]    Objective:    BP 127/90   Pulse 72   Ht 5\' 6"  (1.676 m)   Wt 147 lb (66.7 kg)   BMI 23.73 kg/m   General Appearance:    Alert, cooperative, no distress, appears stated age  Head:    Normocephalic, without obvious abnormality, atraumatic  Eyes:    PERRL, conjunctiva/corneas clear, EOM's intact, fundi    benign, both eyes  Ears:    Normal TM's and external ear canals, both ears  Nose:   Nares normal, septum midline, mucosa normal, no drainage    or sinus tenderness  Throat:   Lips, mucosa, and tongue normal; teeth and gums normal  Neck:   Supple, symmetrical, trachea midline, no adenopathy;    thyroid:  no enlargement/tenderness/nodules; no carotid   bruit or JVD  Back:     Symmetric, no curvature, ROM normal, no CVA tenderness  Lungs:     Clear to  auscultation bilaterally, respirations unlabored  Chest Wall:    No tenderness or deformity   Heart:    Regular rate and rhythm, S1 and S2 normal, no murmur, rub   or gallop  Breast Exam:    No tenderness, masses, or nipple abnormality  Abdomen:     Soft, non-tender, bowel sounds active all four quadrants,    no masses, no organomegaly  Genitalia:    Normal female without lesion, discharge or tenderness, NSSA, NT, mobile, no adnexal masses or tenderness     Extremities:   Extremities normal, atraumatic, no cyanosis or edema  Pulses:   2+ and symmetric all extremities  Skin:   Skin color, texture, turgor normal, no rashes or lesions  Lymph nodes:   Cervical, supraclavicular, and axillary nodes normal  Neurologic:   CNII-XII intact, normal strength, sensation and reflexes    throughout  .    Assessment:    Healthy female exam.   endometriosis Plan:     Thin prep Pap smear. with cotesting camrese (extended cycle) Diflucan prn

## 2016-06-06 ENCOUNTER — Encounter: Payer: Self-pay | Admitting: Obstetrics & Gynecology

## 2016-06-07 LAB — CYTOLOGY - PAP
Adequacy: ABSENT
Diagnosis: NEGATIVE
HPV: NOT DETECTED

## 2016-06-20 ENCOUNTER — Ambulatory Visit: Payer: 59

## 2016-06-23 ENCOUNTER — Ambulatory Visit
Admission: RE | Admit: 2016-06-23 | Discharge: 2016-06-23 | Disposition: A | Discharge status: Home / Self Care | Payer: BLUE CROSS/BLUE SHIELD | Source: Ambulatory Visit | Attending: Obstetrics & Gynecology | Admitting: Obstetrics & Gynecology

## 2016-06-23 DIAGNOSIS — K529 Noninfective gastroenteritis and colitis, unspecified: Secondary | ICD-10-CM | POA: Diagnosis not present

## 2016-06-23 DIAGNOSIS — Z01419 Encounter for gynecological examination (general) (routine) without abnormal findings: Secondary | ICD-10-CM

## 2016-06-23 DIAGNOSIS — Z1231 Encounter for screening mammogram for malignant neoplasm of breast: Secondary | ICD-10-CM | POA: Diagnosis not present

## 2016-07-10 DIAGNOSIS — G43009 Migraine without aura, not intractable, without status migrainosus: Secondary | ICD-10-CM | POA: Diagnosis not present

## 2016-07-10 DIAGNOSIS — G43829 Menstrual migraine, not intractable, without status migrainosus: Secondary | ICD-10-CM | POA: Diagnosis not present

## 2016-08-03 DIAGNOSIS — Z79899 Other long term (current) drug therapy: Secondary | ICD-10-CM | POA: Diagnosis not present

## 2016-08-03 DIAGNOSIS — K529 Noninfective gastroenteritis and colitis, unspecified: Secondary | ICD-10-CM | POA: Diagnosis not present

## 2016-09-07 DIAGNOSIS — K5140 Inflammatory polyps of colon without complications: Secondary | ICD-10-CM | POA: Diagnosis not present

## 2016-09-07 DIAGNOSIS — K5150 Left sided colitis without complications: Secondary | ICD-10-CM | POA: Diagnosis not present

## 2016-09-07 DIAGNOSIS — K5289 Other specified noninfective gastroenteritis and colitis: Secondary | ICD-10-CM | POA: Diagnosis not present

## 2016-09-14 DIAGNOSIS — K529 Noninfective gastroenteritis and colitis, unspecified: Secondary | ICD-10-CM | POA: Diagnosis not present

## 2016-09-14 DIAGNOSIS — Z79899 Other long term (current) drug therapy: Secondary | ICD-10-CM | POA: Diagnosis not present

## 2016-09-22 DIAGNOSIS — K51311 Ulcerative (chronic) rectosigmoiditis with rectal bleeding: Secondary | ICD-10-CM | POA: Diagnosis not present

## 2016-09-25 DIAGNOSIS — G43839 Menstrual migraine, intractable, without status migrainosus: Secondary | ICD-10-CM | POA: Diagnosis not present

## 2016-09-25 DIAGNOSIS — G43009 Migraine without aura, not intractable, without status migrainosus: Secondary | ICD-10-CM | POA: Diagnosis not present

## 2016-10-26 DIAGNOSIS — K5150 Left sided colitis without complications: Secondary | ICD-10-CM | POA: Diagnosis not present

## 2016-10-30 ENCOUNTER — Telehealth: Payer: Self-pay | Admitting: *Deleted

## 2016-10-30 DIAGNOSIS — B9689 Other specified bacterial agents as the cause of diseases classified elsewhere: Secondary | ICD-10-CM

## 2016-10-30 DIAGNOSIS — N760 Acute vaginitis: Secondary | ICD-10-CM

## 2016-10-30 MED ORDER — TINIDAZOLE 500 MG PO TABS: 2.0000 g | ORAL_TABLET | Freq: Every day | ORAL | 2 refills | Status: DC

## 2016-10-30 MED ORDER — CLINDAMYCIN HCL 300 MG PO CAPS: 300.0000 mg | ORAL_CAPSULE | Freq: Two times a day (BID) | ORAL | 2 refills | Status: DC

## 2016-10-30 NOTE — Telephone Encounter (Signed)
Pt called the office c/o breaking out in a hive-like rash after starting the Flagyl.  States she has taken Flagyl one other time and had no problems.  Informed pt to stop Flagyl.  Spoke to pharmacists about chances of allergy with Tindamax as well since that drug is in the same class.  Recommended doing Clindamycin because she could have a reaction to the Tindamax.  Clindamycin sent to pharmacy and instructed pt on medication use and to take Benadryl to help with the rash.

## 2016-10-30 NOTE — Telephone Encounter (Signed)
-----   Message from Heather L Bacon, NT sent at 10/30/2016  8:17 AM EDT ----- Regarding: reaction to flagyl Contact: [phone] Pt believes that she is having an allegoric reaction to flagyl, has a rash.  Wants to know what she needs to do   

## 2016-10-30 NOTE — Telephone Encounter (Deleted)
-----   Message from Lindell SparHeather L Bacon, [place] sent at 10/30/2016  8:17 AM EDT ----- Regarding: reaction to flagyl Contact: [phone] Pt believes that she is having an allegoric reaction to flagyl, has a rash.  Wants to know what she needs to do

## 2016-11-22 DIAGNOSIS — K51011 Ulcerative (chronic) pancolitis with rectal bleeding: Secondary | ICD-10-CM | POA: Diagnosis not present

## 2016-12-06 DIAGNOSIS — K51011 Ulcerative (chronic) pancolitis with rectal bleeding: Secondary | ICD-10-CM | POA: Diagnosis not present

## 2017-01-03 DIAGNOSIS — K51011 Ulcerative (chronic) pancolitis with rectal bleeding: Secondary | ICD-10-CM | POA: Diagnosis not present

## 2017-01-18 DIAGNOSIS — R197 Diarrhea, unspecified: Secondary | ICD-10-CM | POA: Diagnosis not present

## 2017-01-23 DIAGNOSIS — K51511 Left sided colitis with rectal bleeding: Secondary | ICD-10-CM | POA: Diagnosis not present

## 2017-01-23 DIAGNOSIS — K6389 Other specified diseases of intestine: Secondary | ICD-10-CM | POA: Diagnosis not present

## 2017-01-23 DIAGNOSIS — H9203 Otalgia, bilateral: Secondary | ICD-10-CM | POA: Diagnosis not present

## 2017-01-23 DIAGNOSIS — R198 Other specified symptoms and signs involving the digestive system and abdomen: Secondary | ICD-10-CM | POA: Diagnosis not present

## 2017-01-23 DIAGNOSIS — K529 Noninfective gastroenteritis and colitis, unspecified: Secondary | ICD-10-CM | POA: Diagnosis not present

## 2017-01-23 DIAGNOSIS — K51811 Other ulcerative colitis with rectal bleeding: Secondary | ICD-10-CM | POA: Diagnosis not present

## 2017-01-23 DIAGNOSIS — K51918 Ulcerative colitis, unspecified with other complication: Secondary | ICD-10-CM | POA: Diagnosis not present

## 2017-01-23 DIAGNOSIS — Z882 Allergy status to sulfonamides status: Secondary | ICD-10-CM | POA: Diagnosis not present

## 2017-01-23 DIAGNOSIS — K51911 Ulcerative colitis, unspecified with rectal bleeding: Secondary | ICD-10-CM | POA: Diagnosis not present

## 2017-01-23 DIAGNOSIS — K5190 Ulcerative colitis, unspecified, without complications: Secondary | ICD-10-CM | POA: Diagnosis not present

## 2017-01-23 DIAGNOSIS — K5150 Left sided colitis without complications: Secondary | ICD-10-CM | POA: Diagnosis not present

## 2017-01-23 DIAGNOSIS — H1033 Unspecified acute conjunctivitis, bilateral: Secondary | ICD-10-CM | POA: Diagnosis not present

## 2017-01-23 DIAGNOSIS — K51518 Left sided colitis with other complication: Secondary | ICD-10-CM | POA: Diagnosis not present

## 2017-01-23 DIAGNOSIS — H15013 Anterior scleritis, bilateral: Secondary | ICD-10-CM | POA: Diagnosis not present

## 2017-01-23 DIAGNOSIS — K51011 Ulcerative (chronic) pancolitis with rectal bleeding: Secondary | ICD-10-CM | POA: Diagnosis not present

## 2017-01-23 DIAGNOSIS — H15009 Unspecified scleritis, unspecified eye: Secondary | ICD-10-CM | POA: Diagnosis not present

## 2017-01-23 DIAGNOSIS — H109 Unspecified conjunctivitis: Secondary | ICD-10-CM | POA: Diagnosis not present

## 2017-01-23 DIAGNOSIS — H15003 Unspecified scleritis, bilateral: Secondary | ICD-10-CM | POA: Diagnosis not present

## 2017-01-23 DIAGNOSIS — H5789 Other specified disorders of eye and adnexa: Secondary | ICD-10-CM | POA: Diagnosis not present

## 2017-01-23 DIAGNOSIS — R197 Diarrhea, unspecified: Secondary | ICD-10-CM | POA: Diagnosis not present

## 2017-01-23 DIAGNOSIS — G43909 Migraine, unspecified, not intractable, without status migrainosus: Secondary | ICD-10-CM | POA: Diagnosis not present

## 2017-01-23 DIAGNOSIS — K922 Gastrointestinal hemorrhage, unspecified: Secondary | ICD-10-CM | POA: Diagnosis not present

## 2017-01-23 DIAGNOSIS — K51919 Ulcerative colitis, unspecified with unspecified complications: Secondary | ICD-10-CM | POA: Diagnosis not present

## 2017-01-23 DIAGNOSIS — R109 Unspecified abdominal pain: Secondary | ICD-10-CM | POA: Diagnosis not present

## 2017-01-24 DIAGNOSIS — K51919 Ulcerative colitis, unspecified with unspecified complications: Secondary | ICD-10-CM | POA: Diagnosis not present

## 2017-01-24 DIAGNOSIS — K5190 Ulcerative colitis, unspecified, without complications: Secondary | ICD-10-CM | POA: Diagnosis not present

## 2017-01-24 DIAGNOSIS — K51011 Ulcerative (chronic) pancolitis with rectal bleeding: Secondary | ICD-10-CM | POA: Diagnosis not present

## 2017-01-24 DIAGNOSIS — K922 Gastrointestinal hemorrhage, unspecified: Secondary | ICD-10-CM | POA: Diagnosis not present

## 2017-01-24 DIAGNOSIS — K51518 Left sided colitis with other complication: Secondary | ICD-10-CM | POA: Diagnosis not present

## 2017-01-24 DIAGNOSIS — K6389 Other specified diseases of intestine: Secondary | ICD-10-CM | POA: Diagnosis not present

## 2017-01-24 DIAGNOSIS — K51918 Ulcerative colitis, unspecified with other complication: Secondary | ICD-10-CM | POA: Diagnosis not present

## 2017-01-24 DIAGNOSIS — H15009 Unspecified scleritis, unspecified eye: Secondary | ICD-10-CM | POA: Diagnosis not present

## 2017-01-24 DIAGNOSIS — K5150 Left sided colitis without complications: Secondary | ICD-10-CM | POA: Diagnosis not present

## 2017-01-25 DIAGNOSIS — K51011 Ulcerative (chronic) pancolitis with rectal bleeding: Secondary | ICD-10-CM | POA: Diagnosis not present

## 2017-01-25 DIAGNOSIS — H15009 Unspecified scleritis, unspecified eye: Secondary | ICD-10-CM | POA: Diagnosis not present

## 2017-01-25 DIAGNOSIS — K51918 Ulcerative colitis, unspecified with other complication: Secondary | ICD-10-CM | POA: Diagnosis not present

## 2017-01-26 DIAGNOSIS — K51011 Ulcerative (chronic) pancolitis with rectal bleeding: Secondary | ICD-10-CM | POA: Diagnosis not present

## 2017-01-26 DIAGNOSIS — H15013 Anterior scleritis, bilateral: Secondary | ICD-10-CM | POA: Diagnosis not present

## 2017-01-26 DIAGNOSIS — H15009 Unspecified scleritis, unspecified eye: Secondary | ICD-10-CM | POA: Diagnosis not present

## 2017-01-26 DIAGNOSIS — R198 Other specified symptoms and signs involving the digestive system and abdomen: Secondary | ICD-10-CM | POA: Diagnosis not present

## 2017-01-26 DIAGNOSIS — K51918 Ulcerative colitis, unspecified with other complication: Secondary | ICD-10-CM | POA: Diagnosis not present

## 2017-01-26 DIAGNOSIS — K51511 Left sided colitis with rectal bleeding: Secondary | ICD-10-CM | POA: Diagnosis not present

## 2017-01-26 DIAGNOSIS — R109 Unspecified abdominal pain: Secondary | ICD-10-CM | POA: Diagnosis not present

## 2017-01-27 DIAGNOSIS — R197 Diarrhea, unspecified: Secondary | ICD-10-CM | POA: Diagnosis not present

## 2017-01-27 DIAGNOSIS — K51911 Ulcerative colitis, unspecified with rectal bleeding: Secondary | ICD-10-CM | POA: Diagnosis not present

## 2017-01-27 DIAGNOSIS — H15003 Unspecified scleritis, bilateral: Secondary | ICD-10-CM | POA: Diagnosis not present

## 2017-01-29 DIAGNOSIS — Z7952 Long term (current) use of systemic steroids: Secondary | ICD-10-CM | POA: Diagnosis not present

## 2017-01-29 DIAGNOSIS — G43909 Migraine, unspecified, not intractable, without status migrainosus: Secondary | ICD-10-CM | POA: Diagnosis not present

## 2017-01-29 DIAGNOSIS — Z882 Allergy status to sulfonamides status: Secondary | ICD-10-CM | POA: Diagnosis not present

## 2017-01-29 DIAGNOSIS — K5190 Ulcerative colitis, unspecified, without complications: Secondary | ICD-10-CM | POA: Diagnosis not present

## 2017-01-29 DIAGNOSIS — K51019 Ulcerative (chronic) pancolitis with unspecified complications: Secondary | ICD-10-CM | POA: Diagnosis not present

## 2017-01-29 DIAGNOSIS — K529 Noninfective gastroenteritis and colitis, unspecified: Secondary | ICD-10-CM | POA: Diagnosis not present

## 2017-01-29 DIAGNOSIS — K51911 Ulcerative colitis, unspecified with rectal bleeding: Secondary | ICD-10-CM | POA: Diagnosis not present

## 2017-01-29 DIAGNOSIS — K51011 Ulcerative (chronic) pancolitis with rectal bleeding: Secondary | ICD-10-CM | POA: Diagnosis not present

## 2017-01-29 DIAGNOSIS — K5130 Ulcerative (chronic) rectosigmoiditis without complications: Secondary | ICD-10-CM | POA: Diagnosis not present

## 2017-01-29 HISTORY — PX: TOTAL COLECTOMY: SHX852

## 2017-01-30 DIAGNOSIS — K51011 Ulcerative (chronic) pancolitis with rectal bleeding: Secondary | ICD-10-CM | POA: Diagnosis not present

## 2017-02-01 DIAGNOSIS — K51011 Ulcerative (chronic) pancolitis with rectal bleeding: Secondary | ICD-10-CM | POA: Diagnosis not present

## 2017-02-06 DIAGNOSIS — H5213 Myopia, bilateral: Secondary | ICD-10-CM | POA: Diagnosis not present

## 2017-02-19 DIAGNOSIS — Z432 Encounter for attention to ileostomy: Secondary | ICD-10-CM | POA: Diagnosis not present

## 2017-03-06 DIAGNOSIS — K51919 Ulcerative colitis, unspecified with unspecified complications: Secondary | ICD-10-CM | POA: Diagnosis not present

## 2017-03-06 DIAGNOSIS — K51019 Ulcerative (chronic) pancolitis with unspecified complications: Secondary | ICD-10-CM | POA: Diagnosis not present

## 2017-03-06 DIAGNOSIS — Z09 Encounter for follow-up examination after completed treatment for conditions other than malignant neoplasm: Secondary | ICD-10-CM | POA: Diagnosis not present

## 2017-03-12 DIAGNOSIS — Z7952 Long term (current) use of systemic steroids: Secondary | ICD-10-CM | POA: Diagnosis not present

## 2017-03-12 DIAGNOSIS — Z432 Encounter for attention to ileostomy: Secondary | ICD-10-CM | POA: Diagnosis not present

## 2017-03-12 DIAGNOSIS — Z882 Allergy status to sulfonamides status: Secondary | ICD-10-CM | POA: Diagnosis not present

## 2017-03-12 DIAGNOSIS — K51919 Ulcerative colitis, unspecified with unspecified complications: Secondary | ICD-10-CM | POA: Diagnosis not present

## 2017-03-12 DIAGNOSIS — K5190 Ulcerative colitis, unspecified, without complications: Secondary | ICD-10-CM | POA: Diagnosis not present

## 2017-03-13 DIAGNOSIS — Z432 Encounter for attention to ileostomy: Secondary | ICD-10-CM | POA: Diagnosis not present

## 2017-03-15 DIAGNOSIS — Z432 Encounter for attention to ileostomy: Secondary | ICD-10-CM | POA: Diagnosis not present

## 2017-03-16 DIAGNOSIS — R112 Nausea with vomiting, unspecified: Secondary | ICD-10-CM | POA: Diagnosis not present

## 2017-03-16 DIAGNOSIS — R1030 Lower abdominal pain, unspecified: Secondary | ICD-10-CM | POA: Diagnosis not present

## 2017-03-16 DIAGNOSIS — R197 Diarrhea, unspecified: Secondary | ICD-10-CM | POA: Diagnosis not present

## 2017-03-16 DIAGNOSIS — K9189 Other postprocedural complications and disorders of digestive system: Secondary | ICD-10-CM | POA: Diagnosis not present

## 2017-03-17 DIAGNOSIS — R112 Nausea with vomiting, unspecified: Secondary | ICD-10-CM | POA: Diagnosis not present

## 2017-03-17 DIAGNOSIS — K9189 Other postprocedural complications and disorders of digestive system: Secondary | ICD-10-CM | POA: Diagnosis not present

## 2017-03-17 DIAGNOSIS — R109 Unspecified abdominal pain: Secondary | ICD-10-CM | POA: Diagnosis not present

## 2017-03-17 DIAGNOSIS — Z882 Allergy status to sulfonamides status: Secondary | ICD-10-CM | POA: Diagnosis not present

## 2017-03-17 DIAGNOSIS — I81 Portal vein thrombosis: Secondary | ICD-10-CM | POA: Diagnosis not present

## 2017-03-17 DIAGNOSIS — R1030 Lower abdominal pain, unspecified: Secondary | ICD-10-CM | POA: Diagnosis not present

## 2017-03-17 DIAGNOSIS — K5190 Ulcerative colitis, unspecified, without complications: Secondary | ICD-10-CM | POA: Diagnosis not present

## 2017-03-17 DIAGNOSIS — K567 Ileus, unspecified: Secondary | ICD-10-CM | POA: Diagnosis not present

## 2017-03-17 DIAGNOSIS — R197 Diarrhea, unspecified: Secondary | ICD-10-CM | POA: Diagnosis not present

## 2017-03-18 DIAGNOSIS — K9189 Other postprocedural complications and disorders of digestive system: Secondary | ICD-10-CM | POA: Diagnosis not present

## 2017-03-20 DIAGNOSIS — K9189 Other postprocedural complications and disorders of digestive system: Secondary | ICD-10-CM | POA: Diagnosis not present

## 2017-04-03 DIAGNOSIS — K5190 Ulcerative colitis, unspecified, without complications: Secondary | ICD-10-CM | POA: Diagnosis not present

## 2017-04-03 DIAGNOSIS — K51919 Ulcerative colitis, unspecified with unspecified complications: Secondary | ICD-10-CM | POA: Diagnosis not present

## 2017-04-03 DIAGNOSIS — I771 Stricture of artery: Secondary | ICD-10-CM | POA: Diagnosis not present

## 2017-04-03 DIAGNOSIS — R188 Other ascites: Secondary | ICD-10-CM | POA: Diagnosis not present

## 2017-04-03 DIAGNOSIS — K56609 Unspecified intestinal obstruction, unspecified as to partial versus complete obstruction: Secondary | ICD-10-CM | POA: Diagnosis not present

## 2017-04-14 ENCOUNTER — Other Ambulatory Visit: Payer: Self-pay

## 2017-04-14 ENCOUNTER — Encounter: Payer: Self-pay | Admitting: Emergency Medicine

## 2017-04-14 ENCOUNTER — Emergency Department
Admission: EM | Admit: 2017-04-14 | Discharge: 2017-04-14 | Disposition: A | Discharge status: Home / Self Care | Payer: BLUE CROSS/BLUE SHIELD | Attending: Emergency Medicine | Admitting: Emergency Medicine

## 2017-04-14 DIAGNOSIS — Z79899 Other long term (current) drug therapy: Secondary | ICD-10-CM | POA: Diagnosis not present

## 2017-04-14 DIAGNOSIS — R21 Rash and other nonspecific skin eruption: Secondary | ICD-10-CM | POA: Diagnosis present

## 2017-04-14 DIAGNOSIS — L509 Urticaria, unspecified: Secondary | ICD-10-CM | POA: Diagnosis not present

## 2017-04-14 MED ORDER — HYDROXYZINE HCL 50 MG PO TABS
50.0000 mg | ORAL_TABLET | Freq: Once | ORAL | Status: AC
  Administered 2017-04-14: 50 mg via ORAL
  Filled 2017-04-14: qty 1

## 2017-04-14 MED ORDER — FAMOTIDINE 20 MG PO TABS: 20.0000 mg | ORAL_TABLET | Freq: Two times a day (BID) | ORAL | 0 refills | Status: DC

## 2017-04-14 MED ORDER — FAMOTIDINE 20 MG PO TABS
20.0000 mg | ORAL_TABLET | Freq: Once | ORAL | Status: AC
  Administered 2017-04-14: 20 mg via ORAL
  Filled 2017-04-14: qty 1

## 2017-04-14 MED ORDER — HYDROXYZINE HCL 25 MG PO TABS: ORAL_TABLET | ORAL | 0 refills | Status: DC

## 2017-04-14 MED ORDER — METHYLPREDNISOLONE SODIUM SUCC 125 MG IJ SOLR
125.0000 mg | Freq: Once | INTRAMUSCULAR | Status: AC
  Administered 2017-04-14: 125 mg via INTRAMUSCULAR
  Filled 2017-04-14: qty 2

## 2017-04-14 MED ORDER — PREDNISONE 10 MG (21) PO TBPK: ORAL_TABLET | ORAL | 0 refills | Status: DC

## 2017-04-14 NOTE — ED Notes (Signed)
Pt reports taking last benadryl at 1900. Pt reports that she has been taking Flagyl recently for infection but at the time did not remember that it caused rashes.

## 2017-04-14 NOTE — ED Triage Notes (Signed)
Pt presents ambulatory to triage with c/o body rash. Pt has no redness noted to face or neck and is talking freely in complete sentences. Pt reports that this rash started Thursday night. Pt has tried benadryl as well as other options at home. Pt is in NAD.

## 2017-04-14 NOTE — Discharge Instructions (Signed)
Follow up with your primary care provider for symptoms that are not improving over the next few days.  Return to the ER for symptoms that change or worsen if unable to schedule an appointment. 

## 2017-04-14 NOTE — ED Provider Notes (Signed)
[facility] Emergency Department Provider May  ____________________________________________  Time seen: Approximately 10:17 PM  I have reviewed the triage vital signs and the nursing notes.   HISTORY  Chief Complaint Rash   HPI Jasmine May is a 41 y.o. female who presents to the emergency department for evaluation and treatment of rash.  She was placed on 2 antibiotics 9 days prior to the onset of the rash. She stopped them 2 days ago [facility] Today, she states that the rash spread and the itching has become unbearable. She has taken benadryl without relief.  Past Medical History:  Diagnosis Date  . Endometriosis   . Headache   . Ulcerative colitis (HCC) 2007    There are no active problems to display for this patient.   Past Surgical History:  Procedure Laterality Date  . COLON SURGERY    . LEEP  2003  . Oophrectomy  2005   Lt ovary  . OVARIAN CYST REMOVAL  1999   Lt ovary  . TYMPANOSTOMY TUBE PLACEMENT     childhood    Prior to Admission medications   Medication Sig Start Date End Date Taking? Authorizing Provider  calcium-vitamin D (OSCAL WITH D) 250-125 MG-UNIT tablet Take 1 tablet by mouth daily.    [provider]  clindamycin (CLEOCIN) 300 MG capsule Take 1 capsule (300 mg total) by mouth 2 (two) times daily. For seven days 10/30/16   Allie Bossierove, Myra C, MD  DULoxetine (CYMBALTA) 30 MG capsule Take 30 mg by mouth 2 (two) times daily. Pt takes a total of 60 mg/day 02/25/15   [provider]  famotidine (PEPCID) 20 MG tablet Take 1 tablet (20 mg total) by mouth 2 (two) times daily. 04/14/17 04/14/18  Korbin Mapps, Rulon Eisenmengerari B, FNP  folic acid (FOLVITE) 1 MG tablet Take 1 mg by mouth daily.  02/22/15   [provider]  hydrOXYzine (ATARAX/VISTARIL) 25 MG tablet 1-2 tablets every 6 hours as needed for itching 04/14/17   Ama Mcmaster B, FNP  InFLIXimab (REMICADE IV) Inject into the vein every 6  (six) weeks.    [provider]  Levonorgestrel-Ethinyl Estradiol (AMETHIA,CAMRESE) 0.15-0.03 &0.01 MG tablet Take 1 tablet by mouth daily. 06/05/16   Allie Bossierove, Myra C, MD  methotrexate (RHEUMATREX) 2.5 MG tablet Take 7.5 mg by mouth. Pt takes 10 tablets, 2.5 mg; once a week    [provider]  Multiple Vitamins-Minerals (MULTIVITAMIN ADULT PO) Take 1 tablet by mouth daily.    [provider]  predniSONE (STERAPRED UNI-PAK 21 TAB) 10 MG (21) TBPK tablet Take 6 tablets on day 1 Take 5 tablets on day 2 Take 4 tablets on day 3 Take 3 tablets on day 4 Take 2 tablets on day 5 Take 1 tablet on day 6 04/14/17   Yash Cacciola B, FNP  RELPAX 40 MG tablet Take 40 mg by mouth as needed.  02/22/15   [provider]  SUMAtriptan (IMITREX) 25 MG tablet Take 25 mg by mouth as needed for migraine. May repeat in 2 hours if headache persists or recurs.    [provider]    Allergies Sulfa antibiotics and Flagyl [metronidazole]  Family History  Problem Relation Age of Onset  . Cancer Father 7068       bladder and prostate cancer    Social History Social History   Tobacco Use  . Smoking status: Never Smoker  . Smokeless tobacco: Never Used  Substance Use Topics  .  Alcohol use: Yes    Comment: rare  . Drug use: No    Review of Systems  Constitutional: Negative for fever. Respiratory: Negative for cough or shortness of breath.  Musculoskeletal: Negative for myalgias Skin: Positive for rash. Neurological: Negative for numbness or paresthesias. ____________________________________________   PHYSICAL EXAM:  VITAL SIGNS: ED Triage Vitals  Enc Vitals Group     BP 04/14/17 2046 113/83     Pulse Rate 04/14/17 2046 (!) 109     Resp 04/14/17 2046 (!) 22     Temp 04/14/17 2046 97.9 F (36.6 C)     Temp Source 04/14/17 2046 Oral     SpO2 04/14/17 2046 100 %     Weight 04/14/17 2047 146 lb (66.2 kg)     Height 04/14/17 2047 5\' 6"  (1.676 m)     Head  Circumference --      Peak Flow --      Pain Score 04/14/17 2046 9     Pain Loc --      Pain Edu? --      Excl. in GC? --      Constitutional: Well appearing. Eyes: Conjunctivae are clear without discharge or drainage. Nose: No rhinorrhea noted. Mouth/Throat: Airway is patent.  Neck: No stridor. Unrestricted range of motion observed.  Cardiovascular: Capillary refill is <3 seconds.  Respiratory: Respirations are even and unlabored.. Musculoskeletal: Unrestricted range of motion observed. Neurologic: Awake, alert, and oriented x 4.  Skin: Urticarial rash is noted over the face, extremities and trunk.  No rash present on the palms, or soles of the feet.  ____________________________________________   LABS (all labs ordered are listed, but only abnormal results are displayed)  Labs Reviewed - No data to display ____________________________________________  EKG  Not indicated ____________________________________________  RADIOLOGY  Not indicated ____________________________________________   PROCEDURES  Procedures ____________________________________________   INITIAL IMPRESSION / ASSESSMENT AND PLAN / ED COURSE  Jasmine May is a 41 y.o. female who presents to the emergency department for evaluation and treatment of a rash after 9 days of an antibiotic.  She had a surgical procedure at the end of November and then developed an abdominal abscess for which she was placed on 2 separate antibiotics.  She has since stopped both of the antibiotics, but the rash has continued to spread and continues to become more pruritic.  No relief with Benadryl.  While here in the emergency department, she was given prednisone, Pepcid, and Atarax with relief of itching and decrease in urticaria.  She did have pain at the site of the Solu-Medrol injection, however the area was unremarkable on exam.  She was able to ambulate out of the department with a steady gait.  She was given  prescriptions for prednisone, Atarax, and Pepcid.  She is to follow-up with her primary care provider for symptoms that are not improving over the next few days with his medications.  She was instructed to return to the emergency department immediately for any symptoms of concern including shortness of breath or feeling that her throat is closing.   Medications  methylPREDNISolone sodium succinate (SOLU-MEDROL) 125 mg/2 mL injection 125 mg (125 mg Intramuscular Given 04/14/17 2256)  famotidine (PEPCID) tablet 20 mg (20 mg Oral Given 04/14/17 2256)  hydrOXYzine (ATARAX/VISTARIL) tablet 50 mg (50 mg Oral Given 04/14/17 2256)     Pertinent labs & imaging results that were available during my care of the patient were reviewed by me and considered in my medical decision making (see  chart for details). ____________________________________________   FINAL CLINICAL IMPRESSION(S) / ED DIAGNOSES  Final diagnoses:  Urticaria    ED Discharge Orders        Ordered    famotidine (PEPCID) 20 MG tablet  2 times daily     04/14/17 2307    hydrOXYzine (ATARAX/VISTARIL) 25 MG tablet     04/14/17 2307    predniSONE (STERAPRED UNI-PAK 21 TAB) 10 MG (21) TBPK tablet     04/14/17 2307       May:  This document was prepared using Dragon voice recognition software and may include unintentional dictation errors.    Chinita Pesterriplett, Dejanira Pamintuan B, FNP 04/14/17 2327    Jeanmarie PlantMcShane, James A, MD 04/15/17 [phone]

## 2017-04-20 DIAGNOSIS — Z09 Encounter for follow-up examination after completed treatment for conditions other than malignant neoplasm: Secondary | ICD-10-CM | POA: Diagnosis not present

## 2017-04-20 DIAGNOSIS — K51019 Ulcerative (chronic) pancolitis with unspecified complications: Secondary | ICD-10-CM | POA: Diagnosis not present

## 2017-05-09 DIAGNOSIS — J069 Acute upper respiratory infection, unspecified: Secondary | ICD-10-CM | POA: Diagnosis not present

## 2017-05-28 DIAGNOSIS — L821 Other seborrheic keratosis: Secondary | ICD-10-CM | POA: Diagnosis not present

## 2017-05-28 DIAGNOSIS — Z86018 Personal history of other benign neoplasm: Secondary | ICD-10-CM | POA: Diagnosis not present

## 2017-05-28 DIAGNOSIS — L578 Other skin changes due to chronic exposure to nonionizing radiation: Secondary | ICD-10-CM | POA: Diagnosis not present

## 2017-05-28 DIAGNOSIS — L570 Actinic keratosis: Secondary | ICD-10-CM | POA: Diagnosis not present

## 2017-06-19 DIAGNOSIS — K51019 Ulcerative (chronic) pancolitis with unspecified complications: Secondary | ICD-10-CM | POA: Diagnosis not present

## 2017-06-19 DIAGNOSIS — Z6823 Body mass index (BMI) 23.0-23.9, adult: Secondary | ICD-10-CM | POA: Diagnosis not present

## 2017-07-13 ENCOUNTER — Encounter: Payer: Self-pay | Admitting: Obstetrics & Gynecology

## 2017-07-13 ENCOUNTER — Ambulatory Visit (INDEPENDENT_AMBULATORY_CARE_PROVIDER_SITE_OTHER): Payer: BLUE CROSS/BLUE SHIELD | Admitting: Obstetrics & Gynecology

## 2017-07-13 VITALS — BP 113/82 | HR 97 | Ht 66.0 in | Wt 151.0 lb

## 2017-07-13 DIAGNOSIS — Z1151 Encounter for screening for human papillomavirus (HPV): Secondary | ICD-10-CM | POA: Diagnosis not present

## 2017-07-13 DIAGNOSIS — Z124 Encounter for screening for malignant neoplasm of cervix: Secondary | ICD-10-CM

## 2017-07-13 DIAGNOSIS — Z01419 Encounter for gynecological examination (general) (routine) without abnormal findings: Secondary | ICD-10-CM | POA: Diagnosis not present

## 2017-07-13 NOTE — Addendum Note (Signed)
Addended by: Cheree DittoGRAHAM, Rhys Lichty A on: 07/13/2017 11:58 AM   Modules accepted: Orders

## 2017-07-13 NOTE — Progress Notes (Signed)
Subjective:    Jasmine May is a 42 y.o. married P2 (15 and 42 yo kids) female who presents for an annual exam. The patient has no complaints today. The patient is sexually active. GYN screening history: last pap: was normal. The patient wears seatbelts: yes. The patient participates in regular exercise: no. Has the patient ever been transfused or tattooed?: yes. The patient reports that there is not domestic violence in her life.   Menstrual History: OB History    Gravida  3   Para  2   Term  2   Preterm  0   AB  1   Living  2     SAB  1   TAB  0   Ectopic  0   Multiple  0   Live Births  2           Menarche age: [age] No LMP recorded. (Menstrual status: Oral contraceptives).    The following portions of the patient's history were reviewed and updated as appropriate: allergies, current medications, past family history, past medical history, past social history, past surgical history and problem list.  Review of Systems Pertinent items are noted in HPI.   Married for 14 years, occasional positional dyspareunia Fh- no breast/gyn/colon cancer  Objective:    BP 113/82   Pulse 97   Ht 5\' 6"  (1.676 m)   Wt 151 lb (68.5 kg)   BMI 24.37 kg/m   General Appearance:    Alert, cooperative, no distress, appears stated age  Head:    Normocephalic, without obvious abnormality, atraumatic  Eyes:    PERRL, conjunctiva/corneas clear, EOM's intact, fundi    benign, both eyes  Ears:    Normal TM's and external ear canals, both ears  Nose:   Nares normal, septum midline, mucosa normal, no drainage    or sinus tenderness  Throat:   Lips, mucosa, and tongue normal; teeth and gums normal  Neck:   Supple, symmetrical, trachea midline, no adenopathy;    thyroid:  no enlargement/tenderness/nodules; no carotid   bruit or JVD  Back:     Symmetric, no curvature, ROM normal, no CVA tenderness  Lungs:     Clear to auscultation bilaterally, respirations unlabored  Chest Wall:    No  tenderness or deformity   Heart:    Regular rate and rhythm, S1 and S2 normal, no murmur, rub   or gallop  Breast Exam:    No tenderness, masses, or nipple abnormality  Abdomen:     Soft, non-tender, bowel sounds active all four quadrants,    no masses, no organomegaly  Genitalia:    Normal female without lesion, discharge or tenderness, normal size and shape, anteverted, mobile, non-tender, normal adnexal exam      Extremities:   Extremities normal, atraumatic, no cyanosis or edema  Pulses:   2+ and symmetric all extremities  Skin:   Skin color, texture, turgor normal, no rashes or lesions  Lymph nodes:   Cervical, supraclavicular, and axillary nodes normal  Neurologic:   CNII-XII intact, normal strength, sensation and reflexes    throughout  .    Assessment:    Healthy female exam.    Plan:     Thin prep Pap smear. with cotesting

## 2017-07-13 NOTE — Progress Notes (Signed)
Pt had total colonectomy 01/29/17 and has to take abx periodically. She would like BTL consult since abx can effect OCP's

## 2017-07-13 NOTE — Progress Notes (Signed)
Last pap 2016   

## 2017-07-16 LAB — CYTOLOGY - PAP
Diagnosis: NEGATIVE
HPV: NOT DETECTED

## 2017-08-09 ENCOUNTER — Other Ambulatory Visit (HOSPITAL_COMMUNITY): Payer: Self-pay | Admitting: Obstetrics & Gynecology

## 2017-08-09 DIAGNOSIS — Z1231 Encounter for screening mammogram for malignant neoplasm of breast: Secondary | ICD-10-CM

## 2017-08-30 ENCOUNTER — Ambulatory Visit
Admission: RE | Admit: 2017-08-30 | Discharge: 2017-08-30 | Disposition: A | Discharge status: Home / Self Care | Payer: BLUE CROSS/BLUE SHIELD | Source: Ambulatory Visit | Attending: Obstetrics & Gynecology | Admitting: Obstetrics & Gynecology

## 2017-08-30 DIAGNOSIS — Z1231 Encounter for screening mammogram for malignant neoplasm of breast: Secondary | ICD-10-CM | POA: Diagnosis not present

## 2017-11-07 ENCOUNTER — Ambulatory Visit: Payer: BLUE CROSS/BLUE SHIELD | Admitting: Obstetrics & Gynecology

## 2017-11-07 ENCOUNTER — Encounter: Payer: Self-pay | Admitting: Obstetrics & Gynecology

## 2017-11-07 VITALS — BP 114/78 | Resp 16 | Ht 66.0 in | Wt 147.6 lb

## 2017-11-07 DIAGNOSIS — N946 Dysmenorrhea, unspecified: Secondary | ICD-10-CM | POA: Diagnosis not present

## 2017-11-07 MED ORDER — NORETHIN ACE-ETH ESTRAD-FE 1-20 MG-MCG(24) PO TABS: 1.0000 | ORAL_TABLET | Freq: Every day | ORAL | 6 refills | Status: DC

## 2017-11-07 NOTE — Progress Notes (Signed)
   Subjective:    Patient ID: Amadeo GarnetMichelle Lee [place], female    DOB: [birth year], 42 y.o.   MRN: [id number]  HPI  42 yo married P2 here for discussion of painful periods. She had been on OCPs since about 42yo but stopped 3/19 because "I was just tired of taking meds". Her husband got a vasectomy then but has not yet been cleared, so she is using condoms. But since stopping her OCPs her periods have returned and are very painful.     Review of Systems Pap and mammo normal this year    Objective:   Physical Exam Breathing, conversing, and ambulating normally Well nourished, well hydrated White female, no apparent distress Abd- benign     Assessment & Plan:  Dysmenorrhea- re start Lo lo estrin Rec condoms until husband is cleared Rec check with her fam med doc about her TDAP

## 2018-01-08 ENCOUNTER — Other Ambulatory Visit: Payer: Self-pay | Admitting: Nurse Practitioner

## 2018-01-08 DIAGNOSIS — R1084 Generalized abdominal pain: Secondary | ICD-10-CM

## 2018-01-08 DIAGNOSIS — K210 Gastro-esophageal reflux disease with esophagitis: Secondary | ICD-10-CM | POA: Diagnosis not present

## 2018-01-08 DIAGNOSIS — R1314 Dysphagia, pharyngoesophageal phase: Secondary | ICD-10-CM | POA: Diagnosis not present

## 2018-01-08 DIAGNOSIS — K5140 Inflammatory polyps of colon without complications: Secondary | ICD-10-CM | POA: Diagnosis not present

## 2018-01-09 ENCOUNTER — Ambulatory Visit
Admission: RE | Admit: 2018-01-09 | Discharge: 2018-01-09 | Disposition: A | Discharge status: Home / Self Care | Payer: BLUE CROSS/BLUE SHIELD | Source: Ambulatory Visit | Attending: Nurse Practitioner | Admitting: Nurse Practitioner

## 2018-01-09 DIAGNOSIS — R1084 Generalized abdominal pain: Secondary | ICD-10-CM

## 2018-01-09 DIAGNOSIS — R1013 Epigastric pain: Secondary | ICD-10-CM | POA: Diagnosis not present

## 2018-01-25 DIAGNOSIS — K51311 Ulcerative (chronic) rectosigmoiditis with rectal bleeding: Secondary | ICD-10-CM | POA: Diagnosis not present

## 2018-02-27 DIAGNOSIS — R52 Pain, unspecified: Secondary | ICD-10-CM | POA: Diagnosis not present

## 2018-02-27 DIAGNOSIS — G43719 Chronic migraine without aura, intractable, without status migrainosus: Secondary | ICD-10-CM | POA: Diagnosis not present

## 2018-03-01 DIAGNOSIS — E611 Iron deficiency: Secondary | ICD-10-CM | POA: Diagnosis not present

## 2018-03-01 DIAGNOSIS — R5383 Other fatigue: Secondary | ICD-10-CM | POA: Diagnosis not present

## 2018-04-05 DIAGNOSIS — R52 Pain, unspecified: Secondary | ICD-10-CM | POA: Diagnosis not present

## 2018-04-05 DIAGNOSIS — G43719 Chronic migraine without aura, intractable, without status migrainosus: Secondary | ICD-10-CM | POA: Diagnosis not present

## 2018-04-05 DIAGNOSIS — G43829 Menstrual migraine, not intractable, without status migrainosus: Secondary | ICD-10-CM | POA: Diagnosis not present

## 2018-04-22 IMAGING — MG 2D DIGITAL SCREENING BILATERAL MAMMOGRAM WITH CAD AND ADJUNCT TO
9 of 12 series · 9 of 28 positions shown · non-contrast
Comparison: None.

CLINICAL DATA: Screening.

EXAM:
2D DIGITAL SCREENING BILATERAL MAMMOGRAM WITH CAD AND ADJUNCT TOMO

[L MLO synth-2D]
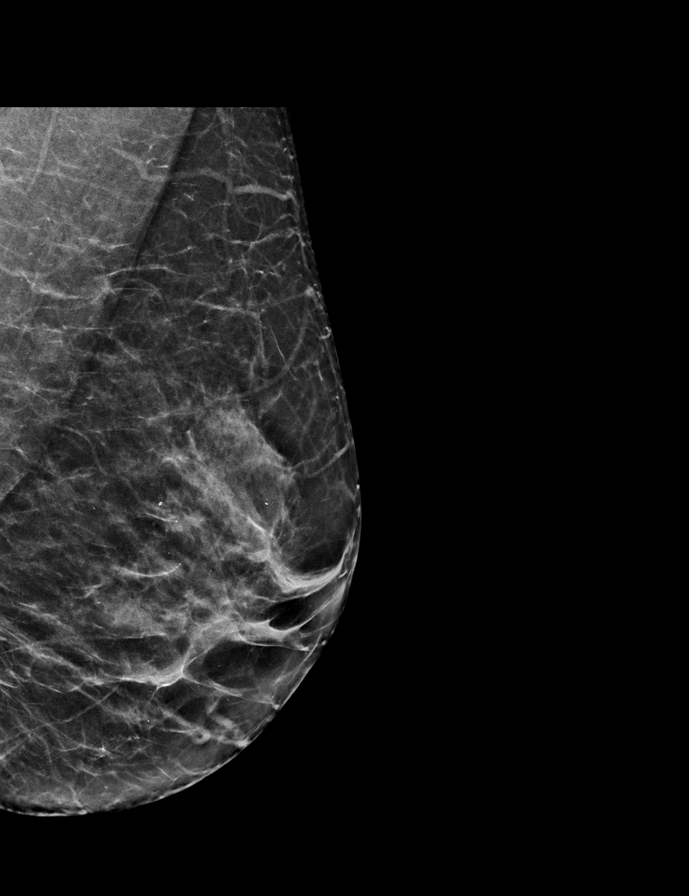

[L MLO]
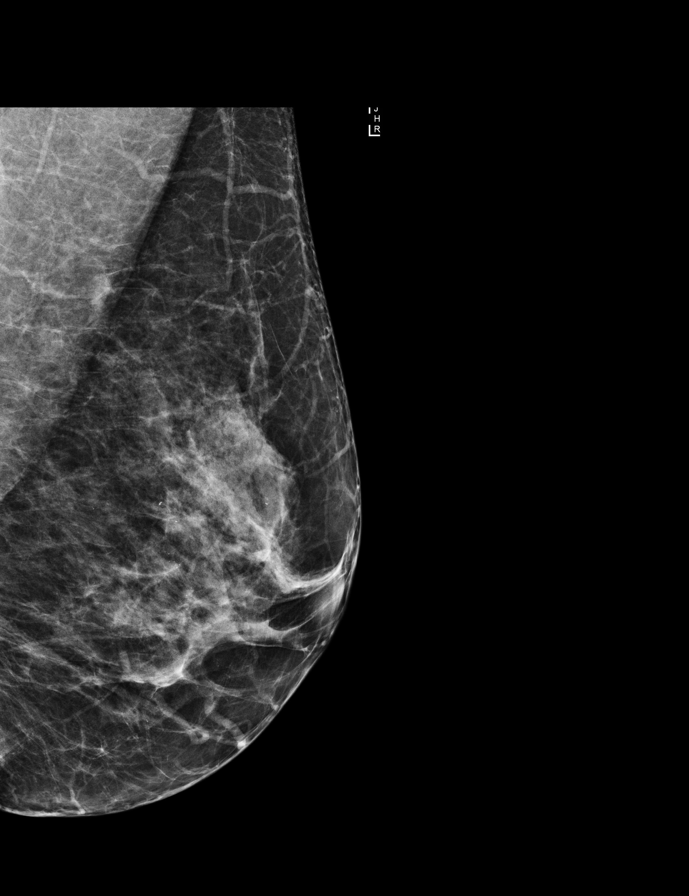

[R MLO synth-2D]
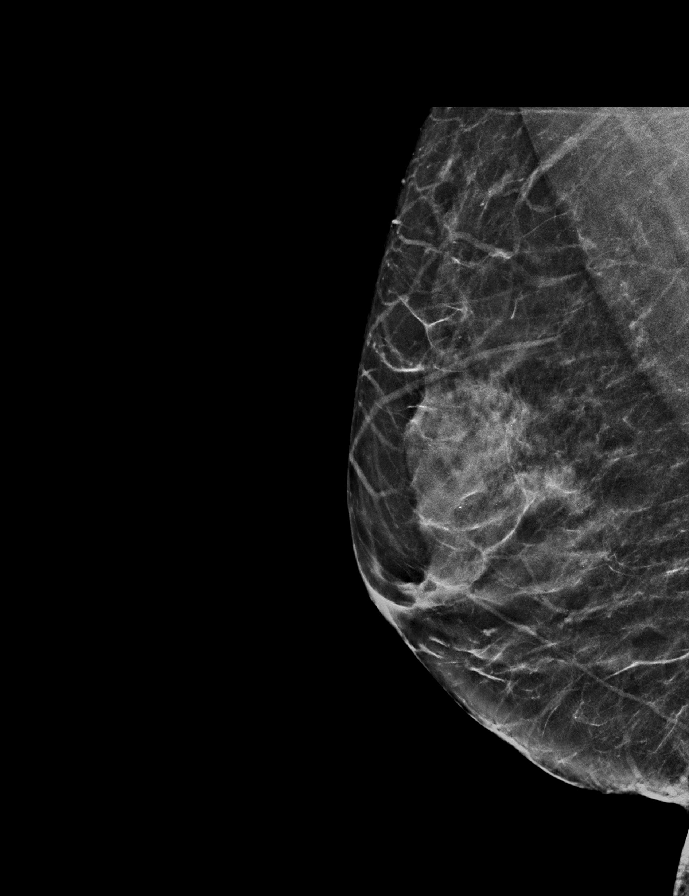

[R MLO]
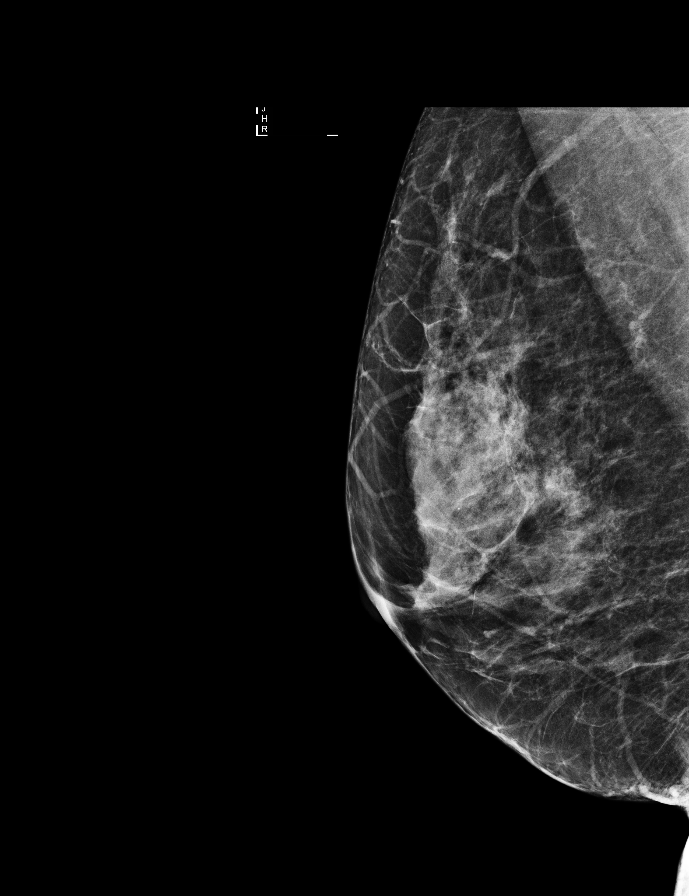

[L CC]
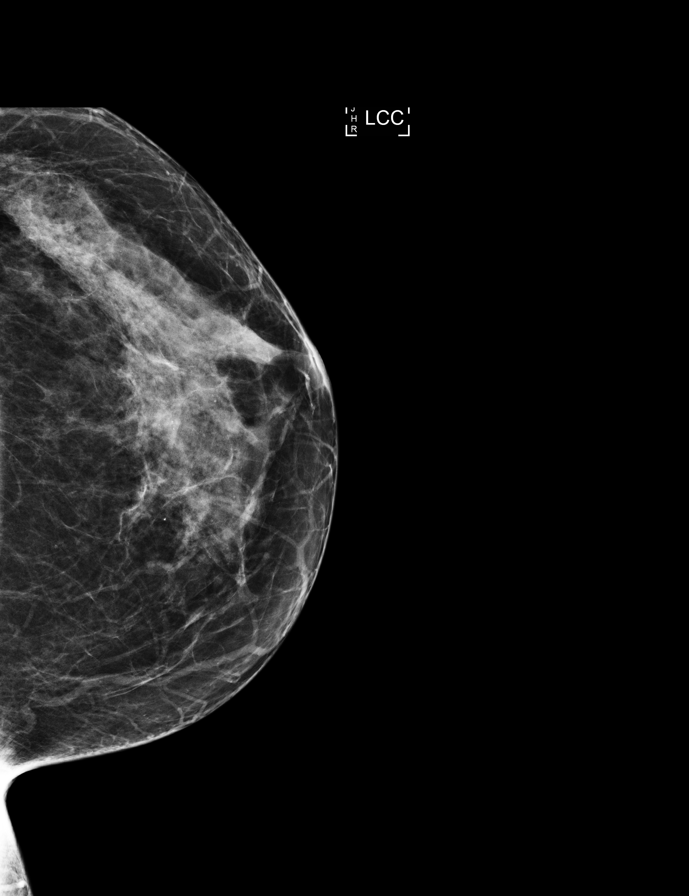

[L CC synth-2D]
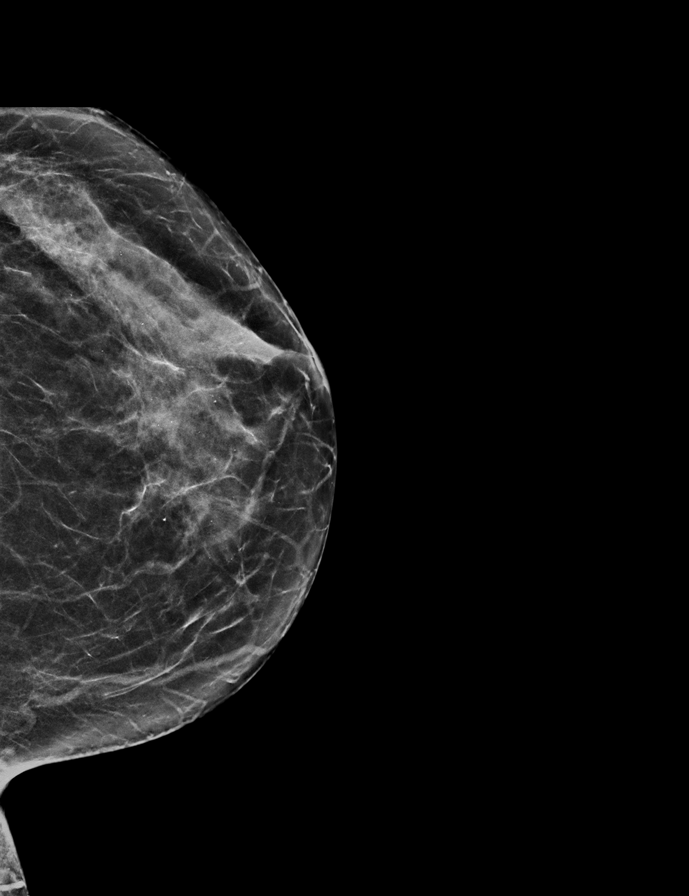

[R CC synth-2D]
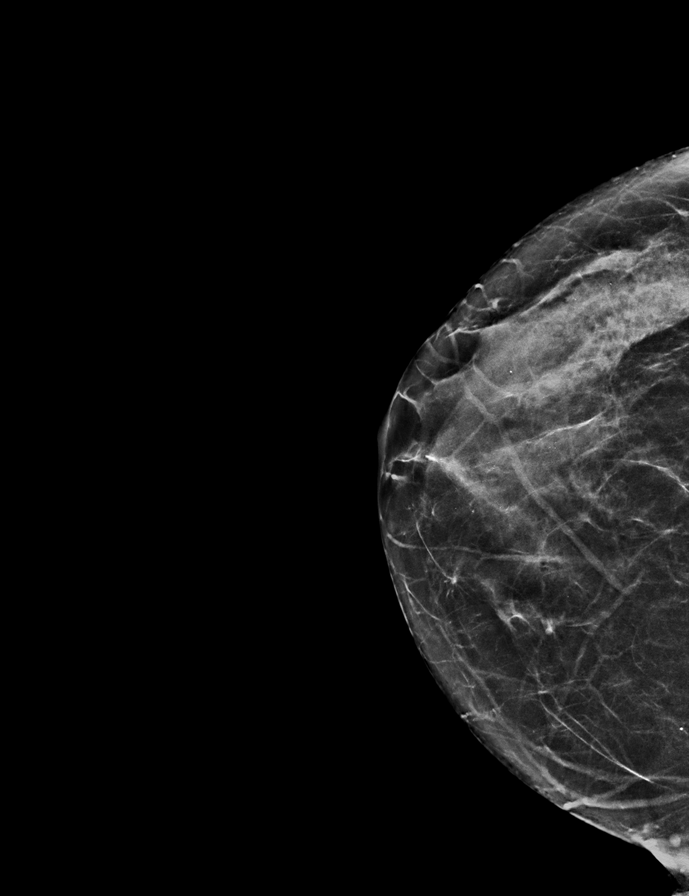

[R CC]
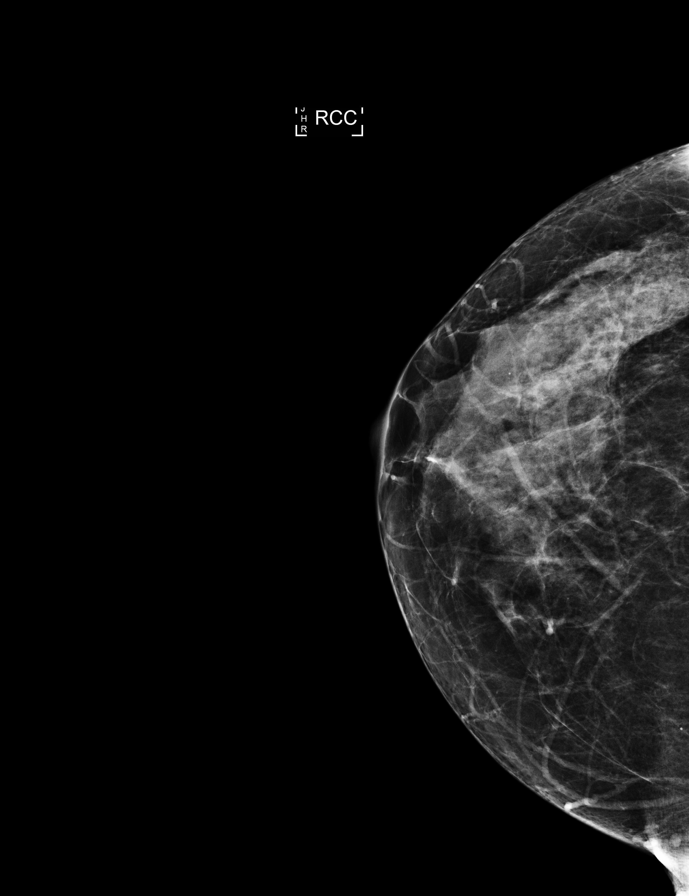

[R CC tomo · tomo slice 27/54.0]
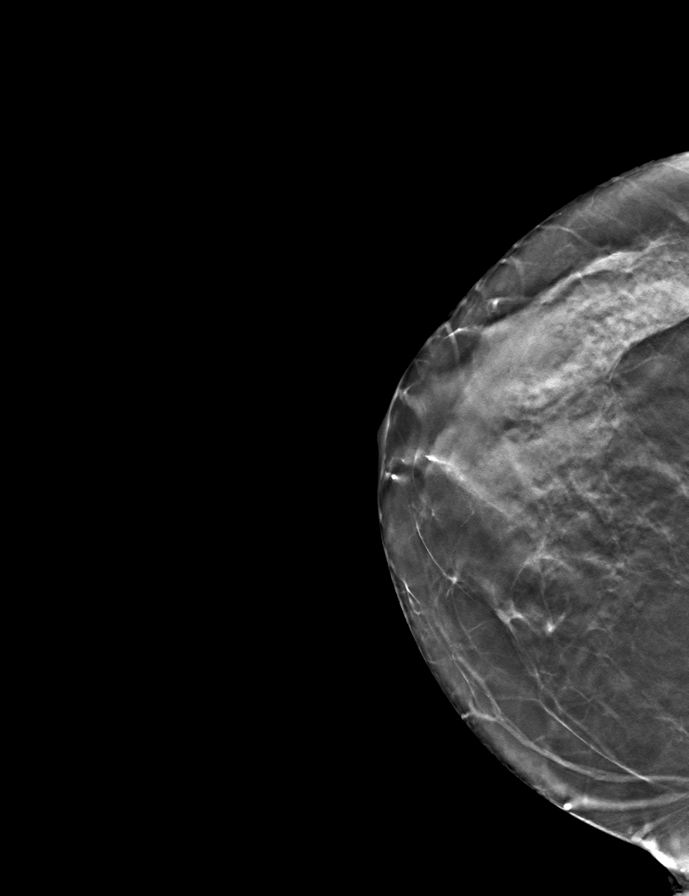

[9 of 28 positions shown; findings below may reference images not displayed]

ACR Breast Density Category c: The breast tissue is heterogeneously
dense, which may obscure small masses
FINDINGS: There are no findings suspicious for malignancy. Images were
processed with CAD.
IMPRESSION: No mammographic evidence of malignancy. A result letter of this
screening mammogram will be mailed directly to the patient.

RECOMMENDATION:
Screening mammogram in one year. (Code:KQ-2-WJT)

BI-RADS CATEGORY  1: Negative.

## 2018-05-02 ENCOUNTER — Encounter: Payer: Self-pay | Admitting: Radiology

## 2018-05-14 DIAGNOSIS — F4320 Adjustment disorder, unspecified: Secondary | ICD-10-CM | POA: Diagnosis not present

## 2018-05-22 DIAGNOSIS — F4320 Adjustment disorder, unspecified: Secondary | ICD-10-CM | POA: Diagnosis not present

## 2018-05-29 DIAGNOSIS — Z1283 Encounter for screening for malignant neoplasm of skin: Secondary | ICD-10-CM | POA: Diagnosis not present

## 2018-05-29 DIAGNOSIS — F4320 Adjustment disorder, unspecified: Secondary | ICD-10-CM | POA: Diagnosis not present

## 2018-05-29 DIAGNOSIS — L578 Other skin changes due to chronic exposure to nonionizing radiation: Secondary | ICD-10-CM | POA: Diagnosis not present

## 2018-05-29 DIAGNOSIS — Z86018 Personal history of other benign neoplasm: Secondary | ICD-10-CM | POA: Diagnosis not present

## 2018-06-05 DIAGNOSIS — F4320 Adjustment disorder, unspecified: Secondary | ICD-10-CM | POA: Diagnosis not present

## 2018-06-07 DIAGNOSIS — G43719 Chronic migraine without aura, intractable, without status migrainosus: Secondary | ICD-10-CM | POA: Diagnosis not present

## 2018-06-07 DIAGNOSIS — R52 Pain, unspecified: Secondary | ICD-10-CM | POA: Diagnosis not present

## 2018-06-12 DIAGNOSIS — F4320 Adjustment disorder, unspecified: Secondary | ICD-10-CM | POA: Diagnosis not present

## 2018-06-19 DIAGNOSIS — F4320 Adjustment disorder, unspecified: Secondary | ICD-10-CM | POA: Diagnosis not present

## 2018-06-26 DIAGNOSIS — F4320 Adjustment disorder, unspecified: Secondary | ICD-10-CM | POA: Diagnosis not present

## 2018-07-03 DIAGNOSIS — F4320 Adjustment disorder, unspecified: Secondary | ICD-10-CM | POA: Diagnosis not present

## 2018-07-10 DIAGNOSIS — F4320 Adjustment disorder, unspecified: Secondary | ICD-10-CM | POA: Diagnosis not present

## 2018-07-17 DIAGNOSIS — F4320 Adjustment disorder, unspecified: Secondary | ICD-10-CM | POA: Diagnosis not present

## 2018-07-25 DIAGNOSIS — F4320 Adjustment disorder, unspecified: Secondary | ICD-10-CM | POA: Diagnosis not present

## 2018-07-26 DIAGNOSIS — G43719 Chronic migraine without aura, intractable, without status migrainosus: Secondary | ICD-10-CM | POA: Diagnosis not present

## 2018-07-30 ENCOUNTER — Other Ambulatory Visit: Payer: Self-pay | Admitting: *Deleted

## 2018-07-30 ENCOUNTER — Encounter: Payer: Self-pay | Admitting: *Deleted

## 2018-07-30 ENCOUNTER — Telehealth: Payer: Self-pay | Admitting: Radiology

## 2018-07-30 MED ORDER — NORETHIN ACE-ETH ESTRAD-FE 1-20 MG-MCG(24) PO TABS: 1.0000 | ORAL_TABLET | Freq: Every day | ORAL | 1 refills | Status: DC

## 2018-07-30 NOTE — Telephone Encounter (Signed)
Left voicemail of cancellation of Annual exam with Dr Marice Potter, [facility] will call patient to reschedule appointment

## 2018-08-06 ENCOUNTER — Ambulatory Visit: Payer: BLUE CROSS/BLUE SHIELD | Admitting: Obstetrics & Gynecology

## 2018-08-22 DIAGNOSIS — F4320 Adjustment disorder, unspecified: Secondary | ICD-10-CM | POA: Diagnosis not present

## 2018-09-13 DIAGNOSIS — F3341 Major depressive disorder, recurrent, in partial remission: Secondary | ICD-10-CM | POA: Diagnosis not present

## 2018-09-13 DIAGNOSIS — K5190 Ulcerative colitis, unspecified, without complications: Secondary | ICD-10-CM | POA: Diagnosis not present

## 2018-09-13 DIAGNOSIS — H9201 Otalgia, right ear: Secondary | ICD-10-CM | POA: Diagnosis not present

## 2018-09-13 DIAGNOSIS — G43009 Migraine without aura, not intractable, without status migrainosus: Secondary | ICD-10-CM | POA: Diagnosis not present

## 2018-10-15 DIAGNOSIS — M26609 Unspecified temporomandibular joint disorder, unspecified side: Secondary | ICD-10-CM | POA: Diagnosis not present

## 2018-11-04 DIAGNOSIS — G43009 Migraine without aura, not intractable, without status migrainosus: Secondary | ICD-10-CM | POA: Diagnosis not present

## 2018-11-11 DIAGNOSIS — R42 Dizziness and giddiness: Secondary | ICD-10-CM | POA: Diagnosis not present

## 2018-11-11 DIAGNOSIS — Z20828 Contact with and (suspected) exposure to other viral communicable diseases: Secondary | ICD-10-CM | POA: Diagnosis not present

## 2019-01-16 ENCOUNTER — Ambulatory Visit: Payer: Self-pay

## 2019-01-16 ENCOUNTER — Other Ambulatory Visit: Payer: Self-pay

## 2019-01-16 VITALS — BP 102/78 | HR 92 | Resp 16 | Ht 66.0 in | Wt 150.0 lb

## 2019-01-16 DIAGNOSIS — Z008 Encounter for other general examination: Secondary | ICD-10-CM

## 2019-01-16 DIAGNOSIS — K5190 Ulcerative colitis, unspecified, without complications: Secondary | ICD-10-CM | POA: Diagnosis not present

## 2019-01-16 LAB — POCT LIPID PANEL
HDL: 62
LDL: 53
Non-HDL: 84
POC Glucose: 160 mg/dl — AB (ref 70–99)
TC/HDL: 2.4
TC: 146
TRG: 157

## 2019-01-16 NOTE — Progress Notes (Signed)
     Patient ID: Jasmine May, female    DOB: September 20, 1975, 43 y.o.   MRN: [id number]    Thank you!!  Apolonio Schneiders RN  [place] [facility]ist [place]: [phone]  Cell:  [phone] Website: [email or web address]

## 2019-03-10 ENCOUNTER — Other Ambulatory Visit: Payer: Self-pay | Admitting: *Deleted

## 2019-03-10 MED ORDER — FLUCONAZOLE 150 MG PO TABS: 150.0000 mg | ORAL_TABLET | Freq: Once | ORAL | 1 refills | Status: AC

## 2019-04-22 ENCOUNTER — Ambulatory Visit (INDEPENDENT_AMBULATORY_CARE_PROVIDER_SITE_OTHER): Payer: BC Managed Care – PPO | Admitting: Obstetrics & Gynecology

## 2019-04-22 ENCOUNTER — Other Ambulatory Visit: Payer: Self-pay

## 2019-04-22 ENCOUNTER — Encounter: Payer: Self-pay | Admitting: Obstetrics & Gynecology

## 2019-04-22 VITALS — BP 96/69 | HR 109 | Ht 66.0 in | Wt 152.0 lb

## 2019-04-22 DIAGNOSIS — Z124 Encounter for screening for malignant neoplasm of cervix: Secondary | ICD-10-CM | POA: Diagnosis not present

## 2019-04-22 DIAGNOSIS — Z01419 Encounter for gynecological examination (general) (routine) without abnormal findings: Secondary | ICD-10-CM | POA: Diagnosis not present

## 2019-04-22 DIAGNOSIS — N946 Dysmenorrhea, unspecified: Secondary | ICD-10-CM

## 2019-04-22 DIAGNOSIS — Z1151 Encounter for screening for human papillomavirus (HPV): Secondary | ICD-10-CM

## 2019-04-22 MED ORDER — NORETHIN ACE-ETH ESTRAD-FE 1-20 MG-MCG(24) PO TABS: ORAL_TABLET | ORAL | 8 refills | Status: DC

## 2019-04-22 NOTE — Progress Notes (Signed)
Having pain near right ovary

## 2019-04-22 NOTE — Progress Notes (Signed)
Subjective:    Jasmine May is a 44 y.o. female who presents for an annual exam. The patient has no complaints today. The patient is sexually active. GYN screening history: last pap: was normal. The patient wears seatbelts: yes. The patient participates in regular exercise: yes. Has the patient ever been transfused or tattooed?: yes. The patient reports that there is not domestic violence in her life.   Menstrual History: OB History    Gravida  3   Para  2   Term  2   Preterm  0   AB  1   Living  2     SAB  1   TAB  0   Ectopic  0   Multiple  0   Live Births  2           Menarche age: 75 Patient's last menstrual period was 04/02/2019 (approximate).    The following portions of the patient's history were reviewed and updated as appropriate: allergies, current medications, past family history, past medical history, past social history, past surgical history and problem list.  Review of Systems Pertinent items are noted in HPI.   FH-  No breast/gyn/colon cancer She had a colectomy due to colitis. 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   Objective:    BP 96/69   Pulse (!) 109   Ht 5\' 6"  (1.676 m)   Wt 152 lb (68.9 kg)   LMP 04/02/2019 (Approximate)   BMI 24.53 kg/m   General Appearance:    Alert, cooperative, no distress, appears stated age  Head:    Normocephalic, without obvious abnormality, atraumatic  Eyes:    PERRL, conjunctiva/corneas clear, EOM's intact, fundi    benign, both eyes  Ears:    Normal TM's and external ear canals, both ears  Nose:   Nares normal, septum midline, mucosa normal, no drainage    or sinus tenderness  Throat:   Lips, mucosa, and tongue normal; teeth and gums normal  Neck:   Supple, symmetrical, trachea midline, no adenopathy;    thyroid:  no enlargement/tenderness/nodules; no carotid   bruit or JVD  Back:     Symmetric, no curvature, ROM normal, no CVA tenderness  Lungs:     Clear to  auscultation bilaterally, respirations unlabored  Chest Wall:    No tenderness or deformity   Heart:    Regular rate and rhythm, S1 and S2 normal, no murmur, rub   or gallop  Breast Exam:    No tenderness, masses, or nipple abnormality  Abdomen:     Soft, non-tender, bowel sounds active all four quadrants,    no masses, no organomegaly  Genitalia:    Normal female without lesion, discharge or tenderness, normal size and shape, anteverted, mobile, non-tender, normal adnexal exam      Extremities:   Extremities normal, atraumatic, no cyanosis or edema  Pulses:   2+ and symmetric all extremities  Skin:   Skin color, texture, turgor normal, no rashes or lesions  Lymph nodes:   Cervical, supraclavicular, and axillary nodes normal  Neurologic:   CNII-XII intact, normal strength, sensation and reflexes    throughout  .    Assessment:    Healthy female exam.   Worsening dysmenorrhea   Plan:     Thin prep Pap smear. with cotesting She can have refills for 3 years without a visit Mammogram Check gyn u/s

## 2019-04-24 LAB — CYTOLOGY - PAP
Comment: NEGATIVE
Diagnosis: NEGATIVE
High risk HPV: NEGATIVE

## 2019-04-28 ENCOUNTER — Ambulatory Visit
Admission: RE | Admit: 2019-04-28 | Discharge: 2019-04-28 | Disposition: A | Discharge status: Home / Self Care | Payer: BC Managed Care – PPO | Source: Ambulatory Visit | Attending: Obstetrics & Gynecology | Admitting: Obstetrics & Gynecology

## 2019-04-28 ENCOUNTER — Other Ambulatory Visit: Payer: Self-pay

## 2019-04-28 DIAGNOSIS — N946 Dysmenorrhea, unspecified: Secondary | ICD-10-CM | POA: Diagnosis not present

## 2019-05-08 ENCOUNTER — Telehealth (INDEPENDENT_AMBULATORY_CARE_PROVIDER_SITE_OTHER): Payer: BC Managed Care – PPO | Admitting: Obstetrics and Gynecology

## 2019-05-08 ENCOUNTER — Encounter: Payer: Self-pay | Admitting: Obstetrics and Gynecology

## 2019-05-08 ENCOUNTER — Other Ambulatory Visit: Payer: Self-pay

## 2019-05-08 DIAGNOSIS — N946 Dysmenorrhea, unspecified: Secondary | ICD-10-CM

## 2019-05-08 DIAGNOSIS — N809 Endometriosis, unspecified: Secondary | ICD-10-CM

## 2019-05-08 MED ORDER — KETOROLAC TROMETHAMINE 10 MG PO TABS: 10.0000 mg | ORAL_TABLET | Freq: Four times a day (QID) | ORAL | 0 refills | Status: DC | PRN

## 2019-05-08 NOTE — Progress Notes (Signed)
Obstetrics and Gynecology Established Patient Evaluation  TELEHEALTH VIRTUAL GYNECOLOGY VISIT ENCOUNTER NOTE  I connected with Jasmine May on 05/08/19 at  4:15 PM EST by telephone at home and verified that I am speaking with the correct person using two identifiers.   I discussed the limitations, risks, security and privacy concerns of performing an evaluation and management service by telephone and the availability of in person appointments. I also discussed with the patient that there may be a patient responsible charge related to this service. The patient expressed understanding and agreed to proceed.  Appointment Date: 05/08/2019  OBGYN Clinic: [facility]  Primary Care Provider: Seward Carol  Chief Complaint:  Chief Complaint  Patient presents with  . Follow-up    History of Present Illness: Jasmine May is a 44 y.o. Caucasian [id number] (No LMP recorded. (Menstrual status: Oral contraceptives).), seen for the above chief complaint.   Patient initially seen by Dr. Hulan Fray and set up for u/s. Dr. Hulan Fray is retiring so patient set up with me to go over results and for follow up.   She saw Dr. Hulan Fray on 1/5 for an annual exam and dysmenorrhea was noted with plan made to check an u/s and refill her OCPs; pt states that at visit Dr. Hulan Fray recommended she switch to continuous OCP use. Last visit prior to this in July 2019 where it was noted she stopped her OCPs a few months prior because her husband had gotten a vasectomy and she wanted to be off hormones after being on OCPs since her teens; at that visit she restarted her OCPs  Patient said that in October she had surgery and was told to stop all her meds and that she didn't restart her OCP and that her s/s got worse at that time. She states she's had worsening pain with her periods but that she does have periods qmonth, regular, no intermenstrual bleeding and it does last 2-3 days and that the OCPs do  help with the bleeding. She also states that she's gradually started having pain outside of her period that feels right sided and in the lower belly and feels like her leg is being pulled out and that the pain gets worse with her period. She also has a h/o endometriosis.   U/s on 1/11 was negative (see below).   Review of Systems: Pertinent items noted in HPI and remainder of comprehensive ROS otherwise negative.   Past Medical History:  Past Medical History:  Diagnosis Date  . Endometriosis   . Headache   . Ulcerative colitis ([place]) 2007    Past Surgical History:  Past Surgical History:  Procedure Laterality Date  . LEEP  2003  . LEFT OOPHORECTOMY  2005   and removed cyst from right ovary. Robotically  . OVARIAN CYST REMOVAL  1999   Lt ovary  . TOTAL COLECTOMY  01/29/2017  . TYMPANOSTOMY TUBE PLACEMENT     childhood    Past Obstetrical History:  OB History  Gravida Para Term Preterm AB Living  3 2 2  0 1 2  SAB TAB Ectopic Multiple Live Births  1 0 0 0 2    # Outcome Date GA Lbr Len/2nd Weight Sex Delivery Anes PTL Lv  3 Term 05/24/02 [redacted]w[redacted]d  7 lb 2 oz (3.232 kg) F    LIV  2 Term 03/13/97 [redacted]w[redacted]d  7 lb 11 oz (3.487 kg) F Vag-Spont   LIV  1 SAB 04/1996 [redacted]w[redacted]d  ND    Past Gynecological History: As per HPI. History of Pap Smear(s): Yes.   Last pap Jan 2021, which was neg and hpv neg She is currently using vasectomy for contraception.   Social History:  Social History   Socioeconomic History  . Marital status: Married    Spouse name: Not on file  . Number of children: Not on file  . Years of education: Not on file  . Highest education level: Not on file  Occupational History  . Not on file  Tobacco Use  . Smoking status: Never Smoker  . Smokeless tobacco: Never Used  Substance and Sexual Activity  . Alcohol use: Yes    Comment: rare  . Drug use: No  . Sexual activity: Yes    Birth control/protection: Pill  Other Topics Concern  . Not on file  Social  History Narrative  . Not on file   Social Determinants of Health   Financial Resource Strain:   . Difficulty of Paying Living Expenses: Not on file  Food Insecurity:   . Worried About Programme researcher, broadcasting/film/video in the Last Year: Not on file  . Ran Out of Food in the Last Year: Not on file  Transportation Needs:   . Lack of Transportation (Medical): Not on file  . Lack of Transportation (Non-Medical): Not on file  Physical Activity:   . Days of Exercise per Week: Not on file  . Minutes of Exercise per Session: Not on file  Stress:   . Feeling of Stress : Not on file  Social Connections:   . Frequency of Communication with Friends and Family: Not on file  . Frequency of Social Gatherings with Friends and Family: Not on file  . Attends Religious Services: Not on file  . Active Member of Clubs or Organizations: Not on file  . Attends Banker Meetings: Not on file  . Marital Status: Not on file  Intimate Partner Violence:   . Fear of Current or Ex-Partner: Not on file  . Emotionally Abused: Not on file  . Physically Abused: Not on file  . Sexually Abused: Not on file    Family History:  Family History  Problem Relation Age of Onset  . Cancer Father 58       bladder and prostate cancer    Medications Kitara L. Loveall had no medications administered during this visit. Current Outpatient Medications  Medication Sig Dispense Refill  . chlorzoxazone (PARAFON) 500 MG tablet Take 500 mg by mouth.    . DULoxetine (CYMBALTA) 30 MG capsule Take 30 mg by mouth 2 (two) times daily. Pt takes a total of 60 mg/day    . Multiple Vitamins-Minerals (MULTIVITAMIN ADULT PO) Take 1 tablet by mouth daily.    . Norethindrone Acetate-Ethinyl Estrad-FE (LOESTRIN 24 FE) 1-20 MG-MCG(24) tablet Take an active pill daily. Will need to get more than 12 packs per year. 3 Package 8  . promethazine (PHENERGAN) 25 MG tablet TAKE 1/2 TO 2 TABLETS BY MOUTH THREE TIMES DAILY, AS NEEDED    . RELPAX 40 MG  tablet Take 40 mg by mouth as needed.     . SUMAtriptan (IMITREX) 25 MG tablet Take 25 mg by mouth as needed for migraine. May repeat in 2 hours if headache persists or recurs.     No current facility-administered medications for this visit.    Allergies Sulfa antibiotics and Flagyl [metronidazole]   Physical Exam:  There were no vitals taken for this visit. There  is no height or weight on file to calculate BMI. Virtual Visit  Laboratory: reviewed  Radiology: CLINICAL DATA:  Dysmenorrhea  EXAM: TRANSABDOMINAL AND TRANSVAGINAL ULTRASOUND OF PELVIS  TECHNIQUE: Both transabdominal and transvaginal ultrasound examinations of the pelvis were performed. Transabdominal technique was performed for global imaging of the pelvis including uterus, ovaries, adnexal regions, and pelvic cul-de-sac. It was necessary to proceed with endovaginal exam following the transabdominal exam to visualize the endometrium and ovaries.  COMPARISON:  01/25/2006  FINDINGS: Uterus  Measurements: 7.6 x 3.3 x 3.3 cm = volume: 57 mL. Anteverted. Heterogeneous myometrium without focal mass  Endometrium  Thickness: 8 mm. Mildly inhomogeneous. Small cystic area 3 x 2 x 3 mm at mid uterus endometrial canal. No additional fluid or focal abnormalities.  Right ovary  Measurements: 2.8 x 1.8 x 1.9 cm = volume: 5.0 mL. Normal morphology without mass  Left ovary  Surgically absent  Other findings  No free pelvic fluid or adnexal masses.  IMPRESSION: Surgical absence of LEFT ovary.  Tiny nonspecific endometrial cyst 3 mm in diameter.  No other definite pelvic sonographic abnormalities.   Electronically Signed   By: Ulyses Southward M.D.   On: 04/28/2019 16:28  Assessment: pt stable  Plan:  It sounds like coming off the OCPs back in October may have exacerbated her s/s and that they may be getting better now. I told her that if she has any issues with breakthrough bleeding or  spotting to let me know and we can see if switching her to an OCP specifically made for continuous dosing will help.   Unfortunately, endometriosis is a chronic disease and until she goes through menopause she will need to be on some sort of hormone suppression. I did bring up that if the OCPs don't seem to be helping any more that there is an oral medication called Dewayne Hatch that is specifically for endometriosis and that can be discussed more.   RTC 3 months for in office u/s to reassess for endometrial cyst and her s/s.   I discussed the assessment and treatment plan with the patient. The patient was provided an opportunity to ask questions and all were answered. The patient agreed with the plan and demonstrated an understanding of the instructions.   The patient was advised to call back or seek an in-person evaluation/go to the ED if the symptoms worsen or if the condition fails to improve as anticipated.  I provided 20 minutes of non-face-to-face time during this encounter. The visit was done via a MyChart visit.   Cornelia Copa MD Attending [facility] [employer])

## 2019-05-12 DIAGNOSIS — N809 Endometriosis, unspecified: Secondary | ICD-10-CM | POA: Insufficient documentation

## 2019-06-09 ENCOUNTER — Other Ambulatory Visit: Payer: Self-pay

## 2019-06-09 ENCOUNTER — Ambulatory Visit
Admission: RE | Admit: 2019-06-09 | Discharge: 2019-06-09 | Disposition: A | Discharge status: Home / Self Care | Payer: BC Managed Care – PPO | Source: Ambulatory Visit | Attending: Obstetrics & Gynecology | Admitting: Obstetrics & Gynecology

## 2019-06-09 ENCOUNTER — Other Ambulatory Visit: Payer: Self-pay | Admitting: Obstetrics & Gynecology

## 2019-06-09 DIAGNOSIS — Z01419 Encounter for gynecological examination (general) (routine) without abnormal findings: Secondary | ICD-10-CM

## 2019-06-09 DIAGNOSIS — Z1231 Encounter for screening mammogram for malignant neoplasm of breast: Secondary | ICD-10-CM

## 2019-07-31 DIAGNOSIS — L578 Other skin changes due to chronic exposure to nonionizing radiation: Secondary | ICD-10-CM | POA: Diagnosis not present

## 2019-07-31 DIAGNOSIS — Z86018 Personal history of other benign neoplasm: Secondary | ICD-10-CM | POA: Diagnosis not present

## 2019-07-31 DIAGNOSIS — Z872 Personal history of diseases of the skin and subcutaneous tissue: Secondary | ICD-10-CM | POA: Diagnosis not present

## 2019-08-08 DIAGNOSIS — Z0000 Encounter for general adult medical examination without abnormal findings: Secondary | ICD-10-CM | POA: Diagnosis not present

## 2019-08-08 DIAGNOSIS — Z13220 Encounter for screening for lipoid disorders: Secondary | ICD-10-CM | POA: Diagnosis not present

## 2019-08-22 DIAGNOSIS — G43829 Menstrual migraine, not intractable, without status migrainosus: Secondary | ICD-10-CM | POA: Diagnosis not present

## 2019-08-22 DIAGNOSIS — G43719 Chronic migraine without aura, intractable, without status migrainosus: Secondary | ICD-10-CM | POA: Diagnosis not present

## 2019-08-22 DIAGNOSIS — R52 Pain, unspecified: Secondary | ICD-10-CM | POA: Diagnosis not present

## 2019-10-29 DIAGNOSIS — G43719 Chronic migraine without aura, intractable, without status migrainosus: Secondary | ICD-10-CM | POA: Diagnosis not present

## 2019-10-29 DIAGNOSIS — M62838 Other muscle spasm: Secondary | ICD-10-CM | POA: Diagnosis not present

## 2019-10-29 DIAGNOSIS — Z1339 Encounter for screening examination for other mental health and behavioral disorders: Secondary | ICD-10-CM | POA: Diagnosis not present

## 2019-10-29 DIAGNOSIS — Z1331 Encounter for screening for depression: Secondary | ICD-10-CM | POA: Diagnosis not present

## 2019-11-07 ENCOUNTER — Ambulatory Visit
Admission: RE | Admit: 2019-11-07 | Discharge: 2019-11-07 | Disposition: A | Discharge status: Home / Self Care | Payer: BC Managed Care – PPO | Source: Ambulatory Visit | Attending: Internal Medicine | Admitting: Internal Medicine

## 2019-11-07 ENCOUNTER — Other Ambulatory Visit: Payer: Self-pay | Admitting: Internal Medicine

## 2019-11-07 DIAGNOSIS — M542 Cervicalgia: Secondary | ICD-10-CM

## 2019-12-16 ENCOUNTER — Other Ambulatory Visit: Payer: Self-pay | Admitting: Obstetrics and Gynecology

## 2020-02-03 DIAGNOSIS — M62838 Other muscle spasm: Secondary | ICD-10-CM | POA: Diagnosis not present

## 2020-02-03 DIAGNOSIS — G43719 Chronic migraine without aura, intractable, without status migrainosus: Secondary | ICD-10-CM | POA: Diagnosis not present

## 2020-02-03 DIAGNOSIS — M542 Cervicalgia: Secondary | ICD-10-CM | POA: Diagnosis not present

## 2020-02-06 DIAGNOSIS — M542 Cervicalgia: Secondary | ICD-10-CM | POA: Diagnosis not present

## 2020-02-13 DIAGNOSIS — Z1159 Encounter for screening for other viral diseases: Secondary | ICD-10-CM | POA: Diagnosis not present

## 2020-02-18 DIAGNOSIS — Z9049 Acquired absence of other specified parts of digestive tract: Secondary | ICD-10-CM | POA: Diagnosis not present

## 2020-02-18 DIAGNOSIS — K91850 Pouchitis: Secondary | ICD-10-CM | POA: Diagnosis not present

## 2020-02-18 DIAGNOSIS — R194 Change in bowel habit: Secondary | ICD-10-CM | POA: Diagnosis not present

## 2020-02-25 ENCOUNTER — Encounter: Payer: Self-pay | Admitting: Radiology

## 2020-03-05 DIAGNOSIS — M542 Cervicalgia: Secondary | ICD-10-CM | POA: Diagnosis not present

## 2020-03-30 DIAGNOSIS — R52 Pain, unspecified: Secondary | ICD-10-CM | POA: Diagnosis not present

## 2020-03-30 DIAGNOSIS — M542 Cervicalgia: Secondary | ICD-10-CM | POA: Diagnosis not present

## 2020-03-30 DIAGNOSIS — G43719 Chronic migraine without aura, intractable, without status migrainosus: Secondary | ICD-10-CM | POA: Diagnosis not present

## 2020-05-21 ENCOUNTER — Other Ambulatory Visit: Payer: Self-pay | Admitting: *Deleted

## 2020-05-21 MED ORDER — NORETHIN ACE-ETH ESTRAD-FE 1-20 MG-MCG(24) PO TABS: ORAL_TABLET | ORAL | 1 refills | Status: DC

## 2020-06-03 DIAGNOSIS — G508 Other disorders of trigeminal nerve: Secondary | ICD-10-CM | POA: Diagnosis not present

## 2020-06-03 DIAGNOSIS — G43719 Chronic migraine without aura, intractable, without status migrainosus: Secondary | ICD-10-CM | POA: Diagnosis not present

## 2020-06-03 DIAGNOSIS — M5481 Occipital neuralgia: Secondary | ICD-10-CM | POA: Diagnosis not present

## 2020-06-03 DIAGNOSIS — R52 Pain, unspecified: Secondary | ICD-10-CM | POA: Diagnosis not present

## 2020-07-28 ENCOUNTER — Other Ambulatory Visit: Payer: Self-pay

## 2020-07-28 ENCOUNTER — Encounter: Payer: Self-pay | Admitting: Advanced Practice Midwife

## 2020-07-28 ENCOUNTER — Ambulatory Visit (INDEPENDENT_AMBULATORY_CARE_PROVIDER_SITE_OTHER): Payer: BC Managed Care – PPO | Admitting: Advanced Practice Midwife

## 2020-07-28 VITALS — BP 108/76 | HR 70 | Ht 66.0 in | Wt 160.0 lb

## 2020-07-28 DIAGNOSIS — Z0000 Encounter for general adult medical examination without abnormal findings: Secondary | ICD-10-CM | POA: Diagnosis not present

## 2020-07-28 DIAGNOSIS — N809 Endometriosis, unspecified: Secondary | ICD-10-CM

## 2020-07-28 NOTE — Patient Instructions (Signed)
Preventive Care 84-45 Years Old, Female Preventive care refers to lifestyle choices and visits with your health care provider that can promote health and wellness. This includes:  A yearly physical exam. This is also called an annual wellness visit.  Regular dental and eye exams.  Immunizations.  Screening for certain conditions.  Healthy lifestyle choices, such as: ? Eating a healthy diet. ? Getting regular exercise. ? Not using drugs or products that contain nicotine and tobacco. ? Limiting alcohol use. What can I expect for my preventive care visit? Physical exam Your health care provider will check your:  Height and weight. These may be used to calculate your BMI (body mass index). BMI is a measurement that tells if you are at a healthy weight.  Heart rate and blood pressure.  Body temperature.  Skin for abnormal spots. Counseling Your health care provider may ask you questions about your:  Past medical problems.  Family's medical history.  Alcohol, tobacco, and drug use.  Emotional well-being.  Home life and relationship well-being.  Sexual activity.  Diet, exercise, and sleep habits.  Work and work Statistician.  Access to firearms.  Method of birth control.  Menstrual cycle.  Pregnancy history. What immunizations do I need? Vaccines are usually given at various ages, according to a schedule. Your health care provider will recommend vaccines for you based on your age, medical history, and lifestyle or other factors, such as travel or where you work.   What tests do I need? Blood tests  Lipid and cholesterol levels. These may be checked every 5 years, or more often if you are over 3 years old.  Hepatitis C test.  Hepatitis B test. Screening  Lung cancer screening. You may have this screening every year starting at age 73 if you have a 30-pack-year history of smoking and currently smoke or have quit within the past 15 years.  Colorectal cancer  screening. ? All adults should have this screening starting at age 52 and continuing until age 17. ? Your health care provider may recommend screening at age 49 if you are at increased risk. ? You will have tests every 1-10 years, depending on your results and the type of screening test.  Diabetes screening. ? This is done by checking your blood sugar (glucose) after you have not eaten for a while (fasting). ? You may have this done every 1-3 years.  Mammogram. ? This may be done every 1-2 years. ? Talk with your health care provider about when you should start having regular mammograms. This may depend on whether you have a family history of breast cancer.  BRCA-related cancer screening. This may be done if you have a family history of breast, ovarian, tubal, or peritoneal cancers.  Pelvic exam and Pap test. ? This may be done every 3 years starting at age 10. ? Starting at age 11, this may be done every 5 years if you have a Pap test in combination with an HPV test. Other tests  STD (sexually transmitted disease) testing, if you are at risk.  Bone density scan. This is done to screen for osteoporosis. You may have this scan if you are at high risk for osteoporosis. Talk with your health care provider about your test results, treatment options, and if necessary, the need for more tests. Follow these instructions at home: Eating and drinking  Eat a diet that includes fresh fruits and vegetables, whole grains, lean protein, and low-fat dairy products.  Take vitamin and mineral supplements  as recommended by your health care provider.  Do not drink alcohol if: ? Your health care provider tells you not to drink. ? You are pregnant, may be pregnant, or are planning to become pregnant.  If you drink alcohol: ? Limit how much you have to 0-1 drink a day. ? Be aware of how much alcohol is in your drink. In the [place] one drink equals one 12 oz bottle of beer (355 mL), one 5 oz glass of  wine (148 mL), or one 1 oz glass of hard liquor (44 mL).   Lifestyle  Take daily care of your teeth and gums. Brush your teeth every morning and night with fluoride toothpaste. Floss one time each day.  Stay active. Exercise for at least 30 minutes 5 or more days each week.  Do not use any products that contain nicotine or tobacco, such as cigarettes, e-cigarettes, and chewing tobacco. If you need help quitting, ask your health care provider.  Do not use drugs.  If you are sexually active, practice safe sex. Use a condom or other form of protection to prevent STIs (sexually transmitted infections).  If you do not wish to become pregnant, use a form of birth control. If you plan to become pregnant, see your health care provider for a prepregnancy visit.  If told by your health care provider, take low-dose aspirin daily starting at age 50.  Find healthy ways to cope with stress, such as: ? Meditation, yoga, or listening to music. ? Journaling. ? Talking to a trusted person. ? Spending time with friends and family. Safety  Always wear your seat belt while driving or riding in a vehicle.  Do not drive: ? If you have been drinking alcohol. Do not ride with someone who has been drinking. ? When you are tired or distracted. ? While texting.  Wear a helmet and other protective equipment during sports activities.  If you have firearms in your house, make sure you follow all gun safety procedures. What's next?  Visit your health care provider once a year for an annual wellness visit.  Ask your health care provider how often you should have your eyes and teeth checked.  Stay up to date on all vaccines. This information is not intended to replace advice given to you by your health care provider. Make sure you discuss any questions you have with your health care provider. Document Revised: 01/06/2020 Document Reviewed: 12/13/2017 Elsevier Patient Education  2021 Elsevier Inc.  

## 2020-07-28 NOTE — Progress Notes (Signed)
GYNECOLOGY ANNUAL PREVENTATIVE CARE ENCOUNTER NOTE  History:     Jasmine May is a 45 y.o. [phone] female here for a routine annual gynecologic exam.  Current complaints: None. Hx endometriosis managed with OCPs.  Happy with current level of management. Denies abnormal vaginal bleeding, discharge, pelvic pain, problems with intercourse or other gynecologic concerns.    Lives with husband and youngest child, who is leaving for college this year at [employer]. Non-smoker, wears seat belts. Adopted an elderly dog a couple months ago, getting a puppy this weekend!   Gynecologic History No LMP recorded. (Menstrual status: Oral contraceptives).  Contraception: OCP (estrogen/progesterone) Last Pap: 04/2019. Results were: normal with negative HPV Last mammogram: 05/2019. Results were: normal  Obstetric History OB History  Gravida Para Term Preterm AB Living  3 2 2  0 1 2  SAB IAB Ectopic Multiple Live Births  1 0 0 0 2    # Outcome Date GA Lbr Len/2nd Weight Sex Delivery Anes PTL Lv  3 Term 05/24/02 103w0d  7 lb 2 oz (3.232 kg) F    LIV  2 Term 03/13/97 [redacted]w[redacted]d  7 lb 11 oz (3.487 kg) F Vag-Spont   LIV  1 SAB 04/1996 [redacted]w[redacted]d       ND    Past Medical History:  Diagnosis Date  . Endometriosis   . Headache   . Ulcerative colitis ([facility]) 2007    Past Surgical History:  Procedure Laterality Date  . LEEP  2003  . LEFT OOPHORECTOMY  2005   and removed cyst from right ovary. Robotically  . OVARIAN CYST REMOVAL  1999   Lt ovary  . TOTAL COLECTOMY  01/29/2017  . TYMPANOSTOMY TUBE PLACEMENT     childhood    Current Outpatient Medications on File Prior to Visit  Medication Sig Dispense Refill  . atenolol (TENORMIN) 25 MG tablet Take by mouth daily.    . DULoxetine (CYMBALTA) 20 MG capsule Take 20 mg by mouth daily.    . Multiple Vitamins-Minerals (MULTIVITAMIN ADULT PO) Take 1 tablet by mouth daily.    . Norethindrone Acetate-Ethinyl Estrad-FE (LOESTRIN 24 FE) 1-20 MG-MCG(24) tablet Take an  active pill daily. Will need to get more than 12 packs per year. 84 tablet 1  . RELPAX 40 MG tablet Take 40 mg by mouth as needed.     . chlorzoxazone (PARAFON) 500 MG tablet Take 500 mg by mouth.    . DULoxetine (CYMBALTA) 30 MG capsule Take 30 mg by mouth 2 (two) times daily. Pt takes a total of 60 mg/day    . promethazine (PHENERGAN) 25 MG tablet TAKE 1/2 TO 2 TABLETS BY MOUTH THREE TIMES DAILY, AS NEEDED    . SUMAtriptan (IMITREX) 25 MG tablet Take 25 mg by mouth as needed for migraine. May repeat in 2 hours if headache persists or recurs.     No current facility-administered medications on file prior to visit.    Allergies  Allergen Reactions  . Sulfa Antibiotics Other (See Comments)    chills  . Flagyl [Metronidazole] Rash    Social History:  reports that she has never smoked. She has never used smokeless tobacco. She reports current alcohol use. She reports that she does not use drugs.  Family History  Problem Relation Age of Onset  . Cancer Father 53       bladder and prostate cancer    The following portions of the patient's history were reviewed and updated as appropriate: allergies, current medications, past  family history, past medical history, past social history, past surgical history and problem list.  Review of Systems Pertinent items noted in HPI and remainder of comprehensive ROS otherwise negative.  Physical Exam:  BP 108/76   Pulse 70   Ht 5\' 6"  (1.676 m)   Wt 160 lb (72.6 kg)   BMI 25.82 kg/m  CONSTITUTIONAL: Well-developed, well-nourished female in no acute distress.  HENT:  Normocephalic, atraumatic, External right and left ear normal.  EYES: Conjunctivae and EOM are normal. Pupils are equal, round, and reactive to light. No scleral icterus.  SKIN: Skin is warm and dry. No rash noted. Not diaphoretic. No erythema. No pallor. MUSCULOSKELETAL: Normal range of motion. No tenderness.  No cyanosis, clubbing, or edema. NEUROLOGIC: Alert and oriented to  person, place, and time. Normal reflexes, muscle tone coordination.  PSYCHIATRIC: Normal mood and affect. Normal behavior. Normal judgment and thought content. CARDIOVASCULAR: Normal heart rate noted, regular rhythm RESPIRATORY: Clear to auscultation bilaterally. Effort and breath sounds normal, no problems with respiration noted. BREASTS: Symmetric in size. No masses, tenderness, skin changes, nipple drainage, or lymphadenopathy bilaterally. Performed in the presence of a chaperone. ABDOMEN: Soft, no distention noted.  No tenderness, rebound or guarding.  PELVIC: Deferred   Assessment and Plan:    1. Well woman exam without gynecological exam - Routine care, no concerns or abnormal findings - MM 3D SCREEN BREAST BILATERAL; Future  2. Endometriosis - Previously counseled about [place] by Dr. - Does not desire change in management at this time   Mammogram scheduled  Routine preventative health maintenance measures emphasized. Please refer to After Visit Summary for other counseling recommendations.      Vergie Living, MSN, CNM Certified Nurse Midwife, [place] [facility], clinical, [facility]

## 2020-07-29 ENCOUNTER — Other Ambulatory Visit: Payer: Self-pay

## 2020-07-29 ENCOUNTER — Ambulatory Visit
Admission: RE | Admit: 2020-07-29 | Discharge: 2020-07-29 | Disposition: A | Discharge status: Home / Self Care | Payer: BC Managed Care – PPO | Source: Ambulatory Visit | Attending: Advanced Practice Midwife | Admitting: Advanced Practice Midwife

## 2020-07-29 DIAGNOSIS — Z1231 Encounter for screening mammogram for malignant neoplasm of breast: Secondary | ICD-10-CM | POA: Diagnosis not present

## 2020-07-29 DIAGNOSIS — Z0000 Encounter for general adult medical examination without abnormal findings: Secondary | ICD-10-CM

## 2020-08-26 DIAGNOSIS — Z0000 Encounter for general adult medical examination without abnormal findings: Secondary | ICD-10-CM | POA: Diagnosis not present

## 2020-08-31 ENCOUNTER — Ambulatory Visit: Payer: BC Managed Care – PPO | Admitting: Obstetrics & Gynecology

## 2020-09-04 ENCOUNTER — Other Ambulatory Visit: Payer: Self-pay | Admitting: Obstetrics and Gynecology

## 2020-09-07 DIAGNOSIS — M62838 Other muscle spasm: Secondary | ICD-10-CM | POA: Diagnosis not present

## 2020-09-07 DIAGNOSIS — G43719 Chronic migraine without aura, intractable, without status migrainosus: Secondary | ICD-10-CM | POA: Diagnosis not present

## 2020-09-07 DIAGNOSIS — R52 Pain, unspecified: Secondary | ICD-10-CM | POA: Diagnosis not present

## 2020-09-15 ENCOUNTER — Other Ambulatory Visit: Payer: Self-pay

## 2020-09-15 ENCOUNTER — Encounter: Payer: Self-pay | Admitting: Family Medicine

## 2020-09-15 ENCOUNTER — Ambulatory Visit: Payer: BC Managed Care – PPO | Admitting: Family Medicine

## 2020-09-15 VITALS — BP 108/63 | HR 86 | Ht 66.0 in | Wt 160.0 lb

## 2020-09-15 DIAGNOSIS — N939 Abnormal uterine and vaginal bleeding, unspecified: Secondary | ICD-10-CM | POA: Diagnosis not present

## 2020-09-15 NOTE — Progress Notes (Signed)
   GYNECOLOGY PROBLEM  VISIT ENCOUNTER NOTE  Subjective:   Jasmine May is a 45 y.o. [phone] female here for a problem GYN visit.  Current complaints: dark vaginal spotting- present for about 1 month. Wears a pad that catches the discharge. She was on antibiotics for GI infection at the time the discharge started. She was recently changed her from [place] to [facility] which is equivalent but this predated by several months her dark discharge.   Denies abnormal vaginal bleeding, discharge, pelvic pain, problems with intercourse or other gynecologic concerns.    Gynecologic History No LMP recorded. (Menstrual status: Oral contraceptives). Contraception: OCP (estrogen/progesterone)  Health Maintenance Due  Topic Date Due  . HIV Screening  Never done  . Hepatitis C Screening  Never done     The following portions of the patient's history were reviewed and updated as appropriate: allergies, current medications, past family history, past medical history, past social history, past surgical history and problem list.  Review of Systems Pertinent items are noted in HPI.   Objective:  BP 108/63   Pulse 86   Ht 5\' 6"  (1.676 m)   Wt 160 lb (72.6 kg)   BMI 25.82 kg/m  Gen: well appearing, NAD HEENT: no scleral icterus CV: RR Lung: Normal WOB Ext: warm well perfused  PELVIC: Normal appearing external genitalia; normal appearing vaginal mucosa and cervix.  No abnormal discharge noted- normal white discharge.   Normal uterine size, no other palpable masses, no uterine or adnexal tenderness.   Assessment and Plan:  1. Vaginal spotting Exam normal, no dark discharge Recommended monitoring sx and feel this might be related to abx use If continued consider EMB   Please refer to After Visit Summary for other counseling recommendations.   Return in about 1 year (around 09/15/2021) for Yearly wellness exam.  11/15/2021, MD, MPH, ABFM Attending Physician Faculty Practice-  [facility]

## 2020-10-01 ENCOUNTER — Other Ambulatory Visit (HOSPITAL_COMMUNITY)
Admission: RE | Admit: 2020-10-01 | Discharge: 2020-10-01 | Disposition: A | Discharge status: Home / Self Care | Payer: BC Managed Care – PPO | Source: Ambulatory Visit | Attending: Obstetrics & Gynecology | Admitting: Obstetrics & Gynecology

## 2020-10-01 ENCOUNTER — Other Ambulatory Visit: Payer: Self-pay

## 2020-10-01 ENCOUNTER — Ambulatory Visit (INDEPENDENT_AMBULATORY_CARE_PROVIDER_SITE_OTHER): Payer: BC Managed Care – PPO | Admitting: *Deleted

## 2020-10-01 VITALS — BP 102/67 | HR 71

## 2020-10-01 DIAGNOSIS — N760 Acute vaginitis: Secondary | ICD-10-CM | POA: Insufficient documentation

## 2020-10-01 DIAGNOSIS — B9689 Other specified bacterial agents as the cause of diseases classified elsewhere: Secondary | ICD-10-CM | POA: Insufficient documentation

## 2020-10-01 DIAGNOSIS — B373 Candidiasis of vulva and vagina: Secondary | ICD-10-CM | POA: Insufficient documentation

## 2020-10-01 DIAGNOSIS — N898 Other specified noninflammatory disorders of vagina: Secondary | ICD-10-CM

## 2020-10-01 DIAGNOSIS — B3731 Acute candidiasis of vulva and vagina: Secondary | ICD-10-CM

## 2020-10-01 NOTE — Progress Notes (Signed)
SUBJECTIVE:  45 y.o. female complains of vaginal irritation for few  day(s). Denies abnormal vaginal bleeding or significant pelvic pain or fever. No UTI symptoms. Denies history of known exposure to STD.  No LMP recorded. (Menstrual status: Oral contraceptives).  OBJECTIVE:  She appears well, afebrile. Urine dipstick: not done.  ASSESSMENT:  Vaginal Irritation   PLAN:  BVAG, CVAG probe sent to lab. Treatment: To be determined once lab results are received ROV prn if symptoms persist or worsen.

## 2020-10-02 DIAGNOSIS — N898 Other specified noninflammatory disorders of vagina: Secondary | ICD-10-CM | POA: Diagnosis not present

## 2020-10-02 DIAGNOSIS — N760 Acute vaginitis: Secondary | ICD-10-CM | POA: Diagnosis not present

## 2020-10-04 LAB — CERVICOVAGINAL ANCILLARY ONLY
Bacterial Vaginitis (gardnerella): POSITIVE — AB
Candida Glabrata: NEGATIVE
Candida Vaginitis: POSITIVE — AB
Comment: NEGATIVE
Comment: NEGATIVE
Comment: NEGATIVE

## 2020-10-04 NOTE — Progress Notes (Signed)
Patient was assessed and managed by nursing staff during this encounter. I have reviewed the chart and agree with the documentation and plan.   Korey Prashad, MD 10/04/2020 12:10 PM 

## 2020-10-05 MED ORDER — CLINDAMYCIN HCL 300 MG PO CAPS: 300.0000 mg | ORAL_CAPSULE | Freq: Two times a day (BID) | ORAL | 0 refills | Status: AC

## 2020-10-05 MED ORDER — FLUCONAZOLE 150 MG PO TABS: 150.0000 mg | ORAL_TABLET | Freq: Once | ORAL | 3 refills | Status: AC

## 2020-10-05 NOTE — Addendum Note (Signed)
Addended by: Jaynie Collins A on: 10/05/2020 09:20 AM   Modules accepted: Orders

## 2020-10-09 DIAGNOSIS — N949 Unspecified condition associated with female genital organs and menstrual cycle: Secondary | ICD-10-CM | POA: Diagnosis not present

## 2020-10-11 ENCOUNTER — Telehealth: Payer: Self-pay | Admitting: *Deleted

## 2020-10-11 NOTE — Telephone Encounter (Signed)
Left message for pt to call back, called her regarding the after hours nurse line message.

## 2020-10-15 DIAGNOSIS — Z9049 Acquired absence of other specified parts of digestive tract: Secondary | ICD-10-CM | POA: Diagnosis not present

## 2020-10-15 DIAGNOSIS — R197 Diarrhea, unspecified: Secondary | ICD-10-CM | POA: Diagnosis not present

## 2020-10-15 DIAGNOSIS — R159 Full incontinence of feces: Secondary | ICD-10-CM | POA: Diagnosis not present

## 2020-10-29 ENCOUNTER — Other Ambulatory Visit: Payer: Self-pay

## 2020-10-29 DIAGNOSIS — Z789 Other specified health status: Secondary | ICD-10-CM

## 2020-10-29 MED ORDER — NORETHIN ACE-ETH ESTRAD-FE 1-20 MG-MCG(24) PO TABS
ORAL_TABLET | ORAL | 3 refills | Status: AC
Start: 2020-10-29 — End: ?

## 2020-11-09 DIAGNOSIS — R52 Pain, unspecified: Secondary | ICD-10-CM | POA: Diagnosis not present

## 2020-11-09 DIAGNOSIS — G243 Spasmodic torticollis: Secondary | ICD-10-CM | POA: Diagnosis not present

## 2020-11-09 DIAGNOSIS — M542 Cervicalgia: Secondary | ICD-10-CM | POA: Diagnosis not present

## 2020-11-09 DIAGNOSIS — G43719 Chronic migraine without aura, intractable, without status migrainosus: Secondary | ICD-10-CM | POA: Diagnosis not present

## 2020-11-15 DIAGNOSIS — Z01419 Encounter for gynecological examination (general) (routine) without abnormal findings: Secondary | ICD-10-CM | POA: Diagnosis not present

## 2020-11-15 DIAGNOSIS — Z6825 Body mass index (BMI) 25.0-25.9, adult: Secondary | ICD-10-CM | POA: Diagnosis not present

## 2020-12-08 DIAGNOSIS — R159 Full incontinence of feces: Secondary | ICD-10-CM | POA: Diagnosis not present

## 2021-01-27 DIAGNOSIS — D485 Neoplasm of uncertain behavior of skin: Secondary | ICD-10-CM | POA: Diagnosis not present

## 2021-01-27 DIAGNOSIS — L820 Inflamed seborrheic keratosis: Secondary | ICD-10-CM | POA: Diagnosis not present

## 2021-01-27 DIAGNOSIS — L821 Other seborrheic keratosis: Secondary | ICD-10-CM | POA: Diagnosis not present

## 2021-01-27 DIAGNOSIS — D225 Melanocytic nevi of trunk: Secondary | ICD-10-CM | POA: Diagnosis not present

## 2021-01-27 DIAGNOSIS — L578 Other skin changes due to chronic exposure to nonionizing radiation: Secondary | ICD-10-CM | POA: Diagnosis not present

## 2021-06-11 ENCOUNTER — Encounter: Payer: Self-pay | Admitting: Radiology

## 2021-06-14 DIAGNOSIS — R197 Diarrhea, unspecified: Secondary | ICD-10-CM | POA: Diagnosis not present

## 2021-08-08 DIAGNOSIS — Z9049 Acquired absence of other specified parts of digestive tract: Secondary | ICD-10-CM | POA: Diagnosis not present

## 2021-08-08 DIAGNOSIS — A498 Other bacterial infections of unspecified site: Secondary | ICD-10-CM | POA: Diagnosis not present

## 2021-08-08 DIAGNOSIS — K91850 Pouchitis: Secondary | ICD-10-CM | POA: Diagnosis not present

## 2021-08-23 DIAGNOSIS — H16143 Punctate keratitis, bilateral: Secondary | ICD-10-CM | POA: Diagnosis not present

## 2021-08-29 DIAGNOSIS — Z0000 Encounter for general adult medical examination without abnormal findings: Secondary | ICD-10-CM | POA: Diagnosis not present

## 2021-10-11 ENCOUNTER — Other Ambulatory Visit: Payer: Self-pay | Admitting: Internal Medicine

## 2021-10-11 DIAGNOSIS — Z1231 Encounter for screening mammogram for malignant neoplasm of breast: Secondary | ICD-10-CM

## 2021-10-20 ENCOUNTER — Ambulatory Visit
Admission: RE | Admit: 2021-10-20 | Discharge: 2021-10-20 | Disposition: A | Discharge status: Home / Self Care | Payer: BC Managed Care – PPO | Source: Ambulatory Visit

## 2021-10-20 DIAGNOSIS — Z1231 Encounter for screening mammogram for malignant neoplasm of breast: Secondary | ICD-10-CM

## 2021-10-24 DIAGNOSIS — R197 Diarrhea, unspecified: Secondary | ICD-10-CM | POA: Diagnosis not present

## 2021-12-13 DIAGNOSIS — R002 Palpitations: Secondary | ICD-10-CM | POA: Diagnosis not present

## 2021-12-27 DIAGNOSIS — R002 Palpitations: Secondary | ICD-10-CM | POA: Diagnosis not present

## 2021-12-28 DIAGNOSIS — R002 Palpitations: Secondary | ICD-10-CM | POA: Diagnosis not present
